# Patient Record
Sex: Female | Born: 1955 | Race: White | Hispanic: No | Marital: Married | Smoking: Never smoker
Health system: Southern US, Community
[De-identification: ages and names within clinical notes are randomized; demographics above are authoritative.]

## PROBLEM LIST (undated history)

## (undated) DIAGNOSIS — M797 Fibromyalgia: Secondary | ICD-10-CM

## (undated) DIAGNOSIS — F419 Anxiety disorder, unspecified: Secondary | ICD-10-CM

---

## 2006-09-01 ENCOUNTER — Encounter: Admission: RE | Admit: 2006-09-01 | Discharge: 2006-09-01 | Payer: Self-pay | Admitting: Family Medicine

## 2007-11-30 ENCOUNTER — Encounter: Admission: RE | Admit: 2007-11-30 | Discharge: 2007-11-30 | Payer: Self-pay | Admitting: Obstetrics and Gynecology

## 2008-11-30 ENCOUNTER — Encounter: Admission: RE | Admit: 2008-11-30 | Discharge: 2008-11-30 | Payer: Self-pay | Admitting: Obstetrics and Gynecology

## 2009-12-03 ENCOUNTER — Encounter: Admission: RE | Admit: 2009-12-03 | Discharge: 2009-12-03 | Payer: Self-pay | Admitting: Obstetrics and Gynecology

## 2010-11-08 ENCOUNTER — Other Ambulatory Visit: Payer: Self-pay | Admitting: Obstetrics and Gynecology

## 2010-11-08 DIAGNOSIS — Z1231 Encounter for screening mammogram for malignant neoplasm of breast: Secondary | ICD-10-CM

## 2010-12-09 ENCOUNTER — Ambulatory Visit
Admission: RE | Admit: 2010-12-09 | Discharge: 2010-12-09 | Disposition: A | Discharge status: Home / Self Care | Payer: BC Managed Care – PPO | Source: Ambulatory Visit | Attending: Obstetrics and Gynecology | Admitting: Obstetrics and Gynecology

## 2010-12-09 DIAGNOSIS — Z1231 Encounter for screening mammogram for malignant neoplasm of breast: Secondary | ICD-10-CM

## 2014-02-22 ENCOUNTER — Ambulatory Visit (INDEPENDENT_AMBULATORY_CARE_PROVIDER_SITE_OTHER): Payer: BC Managed Care – PPO

## 2014-02-22 DIAGNOSIS — M2042 Other hammer toe(s) (acquired), left foot: Secondary | ICD-10-CM

## 2014-02-22 DIAGNOSIS — M778 Other enthesopathies, not elsewhere classified: Secondary | ICD-10-CM

## 2014-02-22 DIAGNOSIS — M7750 Other enthesopathy of unspecified foot: Secondary | ICD-10-CM

## 2014-02-22 DIAGNOSIS — M21622 Bunionette of left foot: Secondary | ICD-10-CM

## 2014-02-22 DIAGNOSIS — G5762 Lesion of plantar nerve, left lower limb: Secondary | ICD-10-CM

## 2014-02-22 DIAGNOSIS — M779 Enthesopathy, unspecified: Secondary | ICD-10-CM

## 2014-02-22 DIAGNOSIS — G5760 Lesion of plantar nerve, unspecified lower limb: Secondary | ICD-10-CM

## 2014-02-22 DIAGNOSIS — R52 Pain, unspecified: Secondary | ICD-10-CM

## 2014-02-22 DIAGNOSIS — M21619 Bunion of unspecified foot: Secondary | ICD-10-CM

## 2014-02-22 DIAGNOSIS — M2040 Other hammer toe(s) (acquired), unspecified foot: Secondary | ICD-10-CM

## 2014-02-22 NOTE — Patient Instructions (Signed)
Pre-Operative Instructions  Congratulations, you have decided to take an important step to improving your quality of life.  You can be assured that the doctors of [facility] will be with you every step of the way.  1. Plan to be at the surgery center/hospital at least 1 (one) hour prior to your scheduled time unless otherwise directed by the surgical center/hospital staff.  You must have a responsible adult accompany you, remain during the surgery and drive you home.  Make sure you have directions to the surgical center/hospital and know how to get there on time. 2. For hospital based surgery you will need to obtain a history and physical form from your family physician within 1 month prior to the date of surgery- we will give you a form for you primary physician.  3. We make every effort to accommodate the date you request for surgery.  There are however, times where surgery dates or times have to be moved.  We will contact you as soon as possible if a change in schedule is required.   4. No Aspirin/Ibuprofen for one week before surgery.  If you are on aspirin, any non-steroidal anti-inflammatory medications (Mobic, Aleve, Ibuprofen) you should stop taking it 7 days prior to your surgery.  You make take Tylenol  For pain prior to surgery.  5. Medications- If you are taking daily heart and blood pressure medications, seizure, reflux, allergy, asthma, anxiety, pain or diabetes medications, make sure the surgery center/hospital is aware before the day of surgery so they may notify you which medications to take or avoid the day of surgery. 6. No food or drink after midnight the night before surgery unless directed otherwise by surgical center/hospital staff. 7. No alcoholic beverages 24 hours prior to surgery.  No smoking 24 hours prior to or 24 hours after surgery. 8. Wear loose pants or shorts- loose enough to fit over bandages, boots, and casts. 9. No slip on shoes, sneakers are best. 10. Bring  your boot with you to the surgery center/hospital.  Also bring crutches or a walker if your physician has prescribed it for you.  If you do not have this equipment, it will be provided for you after surgery. 11. If you have not been contracted by the surgery center/hospital by the day before your surgery, call to confirm the date and time of your surgery. 12. Leave-time from work may vary depending on the type of surgery you have.  Appropriate arrangements should be made prior to surgery with your employer. 13. Prescriptions will be provided immediately following surgery by your doctor.  Have these filled as soon as possible after surgery and take the medication as directed. 14. Remove nail polish on the operative foot. 15. Wash the night before surgery.  The night before surgery wash the foot and leg well with the antibacterial soap provided and water paying special attention to beneath the toenails and in between the toes.  Rinse thoroughly with water and dry well with a towel.  Perform this wash unless told not to do so by your physician.  Enclosed: 1 Ice pack (please put in freezer the night before surgery)   1 Hibiclens skin cleaner   Pre-op Instructions  If you have any questions regarding the instructions, do not hesitate to call our office.  [place]: [address] [place], [place] [address] [phone]  [place]: [address] [place], [place] [address] [phone]  [place]: [address]  [place], [place] [address] [phone]  Dr. Richard   Tuchman DPM, Dr. Norman Regal DPM Dr. Richard Sikora DPM, Dr. M. Todd Hyatt DPM, Dr. Kathryn Egerton DPM 

## 2014-02-22 NOTE — Progress Notes (Signed)
Subjective:    Patient ID: Victoria Merritt, female    DOB: 09/19/1955, 58 y.o.   MRN: [id number]  HPI  PT STATED LT FOOT BETWEEN 2ND AND 3RD TOE IS PAINFUL. FOOT HAVE SURGERY NEUROMA ABOUT ALMOST A YEAR. THE FOOT IS BEEN THE SAME AND STILL PAINFUL WHEN WALKING.  TRIED TAKING HYDROCODONE IT HELP.  ALSO. LT FOOT HAVE A KNOT OUTSIDE  OF THE FOOT AND IT HURT FOR LONG TERM. THE FOOT IS GETTING WORSE, AND SWELL. FOOT GET AGGRAVATED BY WALKING AND TRIED NO TREATMENT.   Review of Systems  HENT: Positive for hearing loss.   Musculoskeletal: Positive for gait problem.  All other systems reviewed and are negative.      Objective:   Physical Exam 58 year old white female well-developed well-nourished oriented x3 presents at this time for second opinion. Patient is a previous left foot surgery was 2 occasions or neuroma removed between her second and third toes metatarsals however continues to be painful also has a painful monitoring of her foot with the fifth metatarsal head or tailor bunion lower extremity objective findings as follows vascular status is intact with pedal pulses palpable DP +2/4 bilateral PT +2/4 bilateral capillary refill time 3 seconds all digits epicritic and proprioceptive sensations intact and symmetric bilateral there is normal plantar response DTRs not elicited dermatologically skin color pigment and hair growth normal decreased hair growth distally nail somewhat criptotic these healed incision scar for bunions bilateral is well-healed incision scar second interspace left foot. No open wounds no ulcers no secondary infection is noted. Orthopedic biomechanical exam patient has a rectus foot type on the right side left foot shows more significant met adductus the hallux appears rectus however there is medial deviation is of lesser digits with claw-type or hammertoe-type contractures adductovarus rotation is to 34 and 5 there is significant prominence of the fifth metatarsal with  lateral deviation of fifth metatarsal head and medial deviation of the fifth digit. X-rays confirm hammertoe deformities 2 through 5 left with medial displacement at the MTP joints of each of the digits as well as hallux slight hallux varus repair and status post Liane Comber bunionectomy which appears to be healing well consolidated again significant tailor bunion noted with elevated I am angle for 5 this is certainly exacerbated by the medial deviation of the digits. Clinically on palpation there is significant pain in the second intermetatarsal space more plantar than dorsal patient apparently had a neuroma but feels like is a knot or ball of the foot where there is a palpable soft tissue swelling in the second interspace cannot rule out a recurrent neuroma or stump neuroma formation.. Apparently her surgeon has recommended fifth metatarsal head resections for the tailor bunion as well as revision neurectomy the plantar incision for removal of recurrent neuroma second interspace. Panel is also suggested hammertoe repairs of the second and third toes.       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 I concur with Dr. Marion Downer assessment with the addition that I would correct all 3 hammertoes second third and fourth because they're all under lapping the fifth digit appears to be adequate and with this method resection should resolve varus and adductus position after concur  and I feel that I agree with Dr. Casilda Carls. Patient indicates that she would go back however there is a time constraint and he can get her into October, patient asked if we would be able to accommodate her sooner. At this time I agreed to perform surgery as are  ready stated fifth met head resection hammertoe repair to 3 and 4 with pin fixations and redo neurectomy second interspace the plantar incision on the left foot. Patient is given no guarantees however should have adequate results and relief of her pain in symptomology and significant deformities. 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

## 2014-03-01 ENCOUNTER — Telehealth: Payer: Self-pay | Admitting: *Deleted

## 2014-03-01 NOTE — Telephone Encounter (Signed)
I'm having surgery on Monday with Dr. Ralene Cork and Peggye Form been told to call and ask you if I'm going to need a boot.  Which I think I am.  If so, can I get it from you in advance?

## 2014-03-02 NOTE — Telephone Encounter (Signed)
I'm a patient of Dr. Ralene Cork.  I'm having surgery on Monday and was told I need to pick up a boot in advance.  I need to find out about this.  Please, someone give me a call, call me after 2 pm today.  I only have today and tomorrow to pick it up.  Please give me a call after 2 pm, thank you.  I called and informed her that she does not need to come by to get a boot.  I told her I spoke to a Production designer, theatre/television/film, Luster Landsberg, at the surgical center and she spoke to the nurse.  Nurse stated she says if you have a boot don't forget to bring it with you.  I told her the boot will be dispensed at the surgical center if it is needed.  She stated okay great.

## 2014-03-06 DIAGNOSIS — G5760 Lesion of plantar nerve, unspecified lower limb: Secondary | ICD-10-CM

## 2014-03-06 DIAGNOSIS — M21619 Bunion of unspecified foot: Secondary | ICD-10-CM

## 2014-03-06 DIAGNOSIS — M2040 Other hammer toe(s) (acquired), unspecified foot: Secondary | ICD-10-CM

## 2014-03-08 ENCOUNTER — Telehealth: Payer: Self-pay

## 2014-03-08 NOTE — Telephone Encounter (Signed)
Left message for pt to call with questions or concerns  

## 2014-03-09 NOTE — Progress Notes (Signed)
Dr Blenda Mounts performed a 5th met head res and left 2nd met neurectomy

## 2014-03-10 ENCOUNTER — Telehealth: Payer: Self-pay | Admitting: *Deleted

## 2014-03-10 NOTE — Telephone Encounter (Signed)
I had surgery with Dr. Ralene Cork on Monday.  In the last 12 to 15 hours I have had a lot of extra pain in one of the surgical spots.  I need to talk to someone about that.  Please call back, thank you.  Message already answered.

## 2014-03-10 NOTE — Telephone Encounter (Signed)
I had surgery with Dr. Ralene Cork on Monday.  He did some Hammertoes, Bunion and a Neuroma.  I'm having a lot of pain with the Hammer Toes.  I have an appointment on Monday.  I don't know if I should try to push through to Monday or get it checked today. It seems to have gotten worse in the last 18 hours.  I called and asked the patient if the bandage felt too tight.  She stated that it does feel tight.  I told her to remove the ace bandage only and re-apply it loosely.  I asked her how often she was taking the pain medication and how many.  She stated that she was taking one every 6-8 hours.  I told her that she can take 2 pills every 4-6 hours.  She stated she would try that and that she has enough pain medication to last her until Monday.

## 2014-03-14 ENCOUNTER — Ambulatory Visit (INDEPENDENT_AMBULATORY_CARE_PROVIDER_SITE_OTHER): Payer: BC Managed Care – PPO

## 2014-03-14 VITALS — BP 115/60 | HR 100 | Resp 12

## 2014-03-14 DIAGNOSIS — M2040 Other hammer toe(s) (acquired), unspecified foot: Secondary | ICD-10-CM

## 2014-03-14 DIAGNOSIS — M21619 Bunion of unspecified foot: Secondary | ICD-10-CM

## 2014-03-14 DIAGNOSIS — M2042 Other hammer toe(s) (acquired), left foot: Secondary | ICD-10-CM

## 2014-03-14 DIAGNOSIS — G5762 Lesion of plantar nerve, left lower limb: Secondary | ICD-10-CM

## 2014-03-14 DIAGNOSIS — M21622 Bunionette of left foot: Secondary | ICD-10-CM

## 2014-03-14 DIAGNOSIS — G5760 Lesion of plantar nerve, unspecified lower limb: Secondary | ICD-10-CM

## 2014-03-14 DIAGNOSIS — Z09 Encounter for follow-up examination after completed treatment for conditions other than malignant neoplasm: Secondary | ICD-10-CM

## 2014-03-14 NOTE — Progress Notes (Signed)
   Subjective:    Patient ID: Victoria Merritt, female    DOB: 24-Aug-1955, 58 y.o.   MRN: [id number]  HPI   5th met head res and left 2nd met neurectomy.  ''LT FOOT HAVE DULL PAIN AND IS HARD TO PUT PRESSURE ON IT.'  Review of Systems no new findings or systemic changes noted     Objective:   Physical Exam Neurovascular status is intact pedal pulses palpable incision is well coapted intact and dry dressings are noted presto compressive dressing was reapplied to the left foot at this time including the plantar incision site x-rays reveal adequate resection of bone fifth metatarsal head and hammertoe repairs digits 23 and 4. Dressing was reapplied maintain elevation ice and moderate activities ambulation with crutches rollabout to       Assessment & Plan:  Assessment good postop progress minimal pain or discomfort presto dressing reapplied maintain ankle ibuprofen as needed for pain maintain rollabout and reduce activities elevated ice when possible. Recheck in one week plan for suture removal and toes. Remove sutures plantar incision in 2 weeks from now we'll reevaluate next visit to make that decision.  Harriet Masson DPM

## 2014-03-14 NOTE — Patient Instructions (Signed)

## 2014-03-21 ENCOUNTER — Ambulatory Visit (INDEPENDENT_AMBULATORY_CARE_PROVIDER_SITE_OTHER): Payer: BC Managed Care – PPO

## 2014-03-21 VITALS — BP 134/85 | HR 78 | Resp 15

## 2014-03-21 DIAGNOSIS — Z09 Encounter for follow-up examination after completed treatment for conditions other than malignant neoplasm: Secondary | ICD-10-CM

## 2014-03-21 DIAGNOSIS — G5762 Lesion of plantar nerve, left lower limb: Secondary | ICD-10-CM

## 2014-03-21 DIAGNOSIS — M21622 Bunionette of left foot: Secondary | ICD-10-CM

## 2014-03-21 DIAGNOSIS — M2042 Other hammer toe(s) (acquired), left foot: Secondary | ICD-10-CM

## 2014-03-21 DIAGNOSIS — G5760 Lesion of plantar nerve, unspecified lower limb: Secondary | ICD-10-CM

## 2014-03-21 DIAGNOSIS — M2040 Other hammer toe(s) (acquired), unspecified foot: Secondary | ICD-10-CM

## 2014-03-21 DIAGNOSIS — M21619 Bunion of unspecified foot: Secondary | ICD-10-CM

## 2014-03-21 NOTE — Progress Notes (Signed)
   Subjective:    Patient ID: Victoria Merritt, female    DOB: 01/18/1956, 58 y.o.   MRN: [id number]  HPI Comments: DOS 03/06/2014 left 5th metatarsal head resection, 2,3,4 hammer toe repair with pins and 2nd neurectomy.  I removed sutures from 2,3,4 toes and 5th metatarsal dorsal surgery site, and plantar 2nd metatarsal space every other suture.     Review of Systems no new findings or systemic changes noted     Objective:   Physical Exam Neurovascular status is intact pedal pulses palpable incisions clean dry well coapted dressings intact and dry at this time sutures are removed from the toes and plantar incision area all the areas are clean dry well coapted at this time in light gauze dressing was reapplied to the forefoot and Coflex wrappings applied to the toe patient will maintain Coflex wrap in her toes leave the dressing on until Sunday at which time she may remove the dressings may resume normal bathing and hygiene washing the foot dry and well apply Neosporin cocoa butter to the incisions. Also [employer] was dispensed to maintain compression after her dressing change and in her begin bathing after Sunday.       Assessment & Plan:  Assessment good postop progress incision is well coapted mild edema and ecchymosis consistent with postop course no dehiscence no discharge or drainage noted me weightbearing at this time with air fracture boot in place reappointed 3-4 weeks for followup x-ray and possible pin removal at that time. Assessment good postop progress again Coflex dispensed to maintain wrapping on toes anklet to maintain compression forefoot starting Sunday may resume normal bathing and hygiene  Alvan Dameichard Michel Hendon DPM

## 2014-03-21 NOTE — Patient Instructions (Addendum)
    ICE INSTRUCTIONS  Apply ice or cold pack to the affected area at least 3 times a day for 10-15 minutes each time.  You should also use ice after prolonged activity or vigorous exercise.  Do not apply ice longer than 20 minutes at one time.  Always keep a cloth between your skin and the ice pack to prevent burns.  Being consistent and following these instructions will help control your symptoms.  We suggest you purchase a gel ice pack because they are reusable and do bit leak.  Some of them are designed to wrap around the area.  Use the method that works best for you.  Here are some other suggestions for icing.   Use a frozen bag of peas or corn-inexpensive and molds well to your body, usually stays frozen for 10 to 20 minutes. Wet a towel with cold water and squeeze out the excess until it's damp.  Place in a bag in the freezer for 20 minutes. Then remove and use.ANTIBACTERIAL SOAP INSTRUCTIONS  THE DAY AFTER PROCEDURE  Please follow the instructions your doctor has marked.   Shower as usual. Before getting out, place a drop of antibacterial liquid soap (Dial) on a wet, clean washcloth.  Gently wipe washcloth over affected area.  Afterward, rinse the area with warm water.  Blot the area dry with a soft cloth and cover with antibiotic ointment (neosporin, polysporin, bacitracin) and band aid or gauze and tape  Place 3-4 drops of antibacterial liquid soap in a quart of warm tap water.  Submerge foot into water for 20 minutes.  If bandage was applied after your procedure, leave on to allow for easy lift off, then remove and continue with soak for the remaining time.  Next, blot area dry with a soft cloth and cover with a bandage.  Apply other medications as directed by your doctor, such as cortisporin otic solution (eardrops) or neosporin antibiotic ointment  Resume normal bathing and hygiene wash with soap and water apply Neosporin or cocoa butter to the incision areas. Maintain Coflex wrap in  the toes to control the edema and swelling. Maintain surgical shoe at all times as instructed.

## 2014-04-11 ENCOUNTER — Ambulatory Visit (INDEPENDENT_AMBULATORY_CARE_PROVIDER_SITE_OTHER): Payer: BC Managed Care – PPO

## 2014-04-11 VITALS — BP 112/78 | HR 74 | Resp 17

## 2014-04-11 DIAGNOSIS — M205X2 Other deformities of toe(s) (acquired), left foot: Secondary | ICD-10-CM

## 2014-04-11 DIAGNOSIS — Z9889 Other specified postprocedural states: Secondary | ICD-10-CM

## 2014-04-11 DIAGNOSIS — M2042 Other hammer toe(s) (acquired), left foot: Secondary | ICD-10-CM

## 2014-04-11 DIAGNOSIS — M21622 Bunionette of left foot: Secondary | ICD-10-CM

## 2014-04-11 DIAGNOSIS — G5762 Lesion of plantar nerve, left lower limb: Secondary | ICD-10-CM

## 2014-04-11 NOTE — Progress Notes (Signed)
   Subjective:    Patient ID: Victoria Merritt, female    DOB: [birth year], 58 y.o.   MRN: [id number]  HPI  Pt presents for routine post op, no complications left hammertoe repair 2,3,4 met, external pins still in place  Review of Systems no new findings or systemic changes noted     Objective:   Physical Exam Patient this time is 5 weeks status post hammertoe repair 234 and 5 and fifth met head resection. Patient flies the dorsal incision is having some delayed healing it is now closing the past week hammertoes appear be well healed and coapted x-rays reveal good position of pin fixation in good alignment of the digits at this time K wires of toes 234 remove Neosporin and a bandage and Coflex wrap in the toes applied patient may resume normal bathing and hygiene normal ambulation without crutches to walk flat in maintain Coflex wrap in the toes may discontinue boot with him next week and resume walking tennis or athletic shoes.       Assessment & Plan:  Assessment good postop progress all incisions appear to be well coapted at this time no discharge no drainage mild edema consistent with postop course noted continue with Coflex wrapping and a compression stocking reappointed in one month for long-term postop followup contact us if any changes or exacerbations in the interim.  Harriet Masson DPM

## 2014-04-11 NOTE — Patient Instructions (Addendum)
ICE INSTRUCTIONS  Apply ice or cold pack to the affected area at least 3 times a day for 10-15 minutes each time.  You should also use ice after prolonged activity or vigorous exercise.  Do not apply ice longer than 20 minutes at one time.  Always keep a cloth between your skin and the ice pack to prevent burns.  Being consistent and following these instructions will help control your symptoms.  We suggest you purchase a gel ice pack because they are reusable and do bit leak.  Some of them are designed to wrap around the area.  Use the method that works best for you.  Here are some other suggestions for icing.   Use a frozen bag of peas or corn-inexpensive and molds well to your body, usually stays frozen for 10 to 20 minutes.  Wet a towel with cold water and squeeze out the excess until it's damp.  Place in a bag in the freezer for 20 minutes. Then remove and use.   Discontinue using crutches may put weight on foot for the first week utilizing the air fracture boot by the end of this week, of the boot and back into comfortable walking tennis or athletic shoes or [employer]'s. Do not walk on your heel to foot flat ground your toes to go flat ground. Maintain Coflex wrap in of toes 23 and 4 with a blue wrap. May resume all normal bathing and hygiene starting tomorrow

## 2014-04-17 ENCOUNTER — Ambulatory Visit (INDEPENDENT_AMBULATORY_CARE_PROVIDER_SITE_OTHER): Payer: BC Managed Care – PPO

## 2014-04-17 ENCOUNTER — Ambulatory Visit (INDEPENDENT_AMBULATORY_CARE_PROVIDER_SITE_OTHER): Payer: BC Managed Care – PPO | Admitting: Podiatry

## 2014-04-17 VITALS — BP 112/65 | HR 74 | Temp 98.1°F | Resp 17

## 2014-04-17 DIAGNOSIS — G8918 Other acute postprocedural pain: Secondary | ICD-10-CM

## 2014-04-17 DIAGNOSIS — L03032 Cellulitis of left toe: Secondary | ICD-10-CM

## 2014-04-17 MED ORDER — CLINDAMYCIN HCL 300 MG PO CAPS: 300.0000 mg | ORAL_CAPSULE | Freq: Three times a day (TID) | ORAL | Status: DC

## 2014-04-17 MED ORDER — OXYCODONE-ACETAMINOPHEN 5-325 MG PO TABS: 1.0000 | ORAL_TABLET | Freq: Four times a day (QID) | ORAL | Status: AC | PRN

## 2014-04-17 MED ORDER — CIPROFLOXACIN HCL 500 MG PO TABS
500.0000 mg | ORAL_TABLET | Freq: Two times a day (BID) | ORAL | Status: DC
Start: 2014-04-17 — End: 2014-04-28

## 2014-04-17 NOTE — Patient Instructions (Signed)
Start antibiotics. Monitor for any signs/symptoms of worsening infection. Call the office immediately if any occur or go directly to the emergency room. Call with any questions/concerns.

## 2014-04-17 NOTE — Progress Notes (Signed)
   Subjective:    Patient ID: Victoria Merritt, female    DOB: 04/23/1956, 58 y.o.   MRN: [id number]  HPI Victoria Merritt, 58 year old female, presents with complaints of increased pain, redness, and warmth to the digits. She recently had surgery on 03/06/14. At last point and patient had K wires removed. She states that after the wires removed she noticed increasing swelling and pain to the area as well as redness. She states that she has pain with range of motion of the digits. She denies any fevers, chills, nausea, vomiting. She denies any recent injury or trauma to the foot. No other complaints at this time.   Review of Systems  All other systems reviewed and are negative.      Objective:   Physical Exam Objective: AAO x3, NAD DP/PT pulses palpable bilaterally, CRT less than 3 seconds Protective sensation intact with Simms Weinstein monofilament, vibratory sensation intact, Achilles tendon reflex intact Mild erythema to the lesser digits on the left foot extending just proximal to the MTPJs. There is mild edema present. There is no areas of fluctuance or crepitus. There is mild increase in warmth over the digits. There is no ascending cellulitis. Range of motion of the MTPJ's intact. There are no open lesions. Incisions well coapted without any evidence of dehiscence. No calf pain with compression, swelling, warmth, erythema.      Assessment & Plan:  58 year old female presents with increased swelling, warmth, redness over her lesser digits left foot extending just proximal to the MTPJ's. -X-rays were obtained and reviewed with the patient. -Treatment options discussed including alternatives, risks, complications.  -For concern of infection prescribed clindamycin and ciprofloxacin. -Renewed Percocet due to pain. -Continue cam boot in the interim until symptoms resolve. -Follow up in 1 week with Dr. Ralene CorkSikora. In the meantime call the office many questions, concerns, change in symptoms.  Monitor for any clinical signs or symptoms of worsening infection and directed to call the office immediately if any are to occur ago directly to the emergency room.

## 2014-04-27 ENCOUNTER — Ambulatory Visit: Payer: BC Managed Care – PPO | Admitting: Podiatry

## 2014-04-28 ENCOUNTER — Encounter: Payer: Self-pay | Admitting: Podiatry

## 2014-04-28 ENCOUNTER — Ambulatory Visit (INDEPENDENT_AMBULATORY_CARE_PROVIDER_SITE_OTHER): Payer: BC Managed Care – PPO | Admitting: Podiatry

## 2014-04-28 VITALS — BP 115/68 | HR 98 | Temp 97.6°F | Resp 13

## 2014-04-28 DIAGNOSIS — G8918 Other acute postprocedural pain: Secondary | ICD-10-CM

## 2014-04-28 DIAGNOSIS — Z09 Encounter for follow-up examination after completed treatment for conditions other than malignant neoplasm: Secondary | ICD-10-CM

## 2014-04-28 DIAGNOSIS — L03032 Cellulitis of left toe: Secondary | ICD-10-CM

## 2014-04-28 MED ORDER — CIPROFLOXACIN HCL 500 MG PO TABS: 500.0000 mg | ORAL_TABLET | Freq: Two times a day (BID) | ORAL | Status: AC

## 2014-04-28 MED ORDER — CLINDAMYCIN HCL 300 MG PO CAPS: 300.0000 mg | ORAL_CAPSULE | Freq: Three times a day (TID) | ORAL | Status: AC

## 2014-04-28 NOTE — Patient Instructions (Signed)
Monitor for any signs/symptoms of infection. Call the office immediately if any occur or go directly to the emergency room. Call with any questions/concerns.  

## 2014-05-01 NOTE — Progress Notes (Signed)
Patient ID: Victoria Penneratricia Merritt, female   DOB: 07/15/1955, 58 y.o.   MRN: [id number]  Subjective: Victoria Merritt, 58 year old female, returns to the office for follow-up evaluation of left foot pain and redness. She does believe that the redness has decreased since her last appointment, although she states she continues to have pain to the area and there is still some redness, which was not present before this started. She has completed the course of antibiotics. She also states that she is very active with work, and has been on her feet a lot. She has not been able to wrap the toes to help decrease the edema, as they were painful to do so. She has been continuing with the CAM boot. Denies any systemic complaints such as fevers, chills, nausea, vomiting. No acute changes since last appointment. No other complaints at this time.   Objective: AAO x3, NAD DP/PT pulses palpable bilaterally, CRT less than 3 seconds Protective sensation intact with Simms Weinstein monofilament, vibratory sensation intact, Achilles tendon reflex intact Decreased erythema to the digits 2-5 on the left, although they are slightly erythematous. There is mild edema to the digits and the forefoot. No increase in warmth noted. Incisions are well coapted without any evidence of dehiscence.  No ascending cellulitis. No areas of fluctuance or crepitance.  No open lesions No calf pain with compression, swelling, warmth, erythema.   Assessment: 58 year old female with decreased erythema/edema to lesser digits left foot   Plan: -Treatment options discussed including all alternatives, risks, complications. -At this time there is decreased erythema, likely a result of the edema. However, will continue antibiotic for one more week. Clinda/Cipro was renewed and sent to the patients pharmacy. Continue to monitor for any clinical signs/symptoms of infection and directed to call the office immediately if any occur.  -Discussed with the patient to  continue wrapping the toes to help decrease the edema.  -She can continue wearing the CAM boot as needed, but can transition back into a regular shoe as tolerated.  -Follow-up as scheduled with Dr. Ralene CorkSikora, or sooner if needed. In the meantime, call the office with any questions, concerns, change in symptoms.

## 2014-05-09 ENCOUNTER — Ambulatory Visit (INDEPENDENT_AMBULATORY_CARE_PROVIDER_SITE_OTHER): Payer: BC Managed Care – PPO

## 2014-05-09 VITALS — BP 140/81 | HR 91 | Resp 12

## 2014-05-09 DIAGNOSIS — M2042 Other hammer toe(s) (acquired), left foot: Secondary | ICD-10-CM

## 2014-05-09 DIAGNOSIS — M205X2 Other deformities of toe(s) (acquired), left foot: Secondary | ICD-10-CM

## 2014-05-09 DIAGNOSIS — Z9889 Other specified postprocedural states: Secondary | ICD-10-CM

## 2014-05-09 DIAGNOSIS — G8918 Other acute postprocedural pain: Secondary | ICD-10-CM

## 2014-05-09 DIAGNOSIS — M21622 Bunionette of left foot: Secondary | ICD-10-CM

## 2014-05-09 NOTE — Patient Instructions (Signed)
ICE INSTRUCTIONS  Apply ice or cold pack to the affected area at least 3 times a day for 10-15 minutes each time.  You should also use ice after prolonged activity or vigorous exercise.  Do not apply ice longer than 20 minutes at one time.  Always keep a cloth between your skin and the ice pack to prevent burns.  Being consistent and following these instructions will help control your symptoms.  We suggest you purchase a gel ice pack because they are reusable and do bit leak.  Some of them are designed to wrap around the area.  Use the method that works best for you.  Here are some other suggestions for icing.   Use a frozen bag of peas or corn-inexpensive and molds well to your body, usually stays frozen for 10 to 20 minutes.  Wet a towel with cold water and squeeze out the excess until it's damp.  Place in a bag in the freezer for 20 minutes. Then remove and use.  Alternate warm compress and ice pack to help with postoperative edema and swelling stop operative long-term pain. Also suggested using Advil or ibuprofen 1 or 2 tablets 2 or 3 times daily for any postoperative inflammation and swelling and pain

## 2014-05-09 NOTE — Progress Notes (Signed)
   Subjective:    Patient ID: Victoria Merritt, female    DOB: 11/25/1955, 58 y.o.   MRN: [id number]  HPI Comments: DOS 03/06/14 left 5th metatarsal head resection, 2,3,4th hammer toe repair with pins.  Pt states the foot is still swollen and the toes are red.  Pt states she is attempting to transition into regular everyday shoe.     Review of Systems     Objective:   Physical Exam        Assessment & Plan:

## 2014-05-09 NOTE — Progress Notes (Signed)
   Subjective:    Patient ID: Victoria Merritt, female    DOB: 19-Jun-1956, 58 y.o.   MRN: [id number]  HPIpatient presents at this time for postop follow-up patient proxy 2 months status post hammertoe repair 234 and 5 and fifth met head resection of the left foot patient had some convocation after the K wires were removed early developed an infection of the foot are some possible localized cellulitis has gone through 2 rounds of antibiotics including clindamycin and Cipro and that seems to have helped and resolved the swelling and infection in pain    Review of Systemsno new findings or systemic changes noted     Objective:   Physical Exam Neurovascular status is intact pedal pulses are palpable DP and PT +2 over 4 capillary refill time 3 seconds. Epicritic sensations intact and symmetric bilateral patient is still having some occasional soreness or full-thickness sensation over the dorsum of the foot and lateral aspect of the foot and minimal edema the digits is noted no ecchymosis no erythema no increased temperature no fever chills no other systemic signs no cellulitis identified the wounds are well coapted at this time. Skin color pigment noted well-healed incisions are noted       Assessment & Plan:  Assessment good postop progress x-rays confirm good position the digits no osseous abnormalities at resection of fifth metatarsal head and relatively rectus digits 234 and 5 with consolidation of the arthroplasty or arthrodesis sites noting arthrosis versus complete consolidation. No pain or discomfort occasionally some twinging occasionally some aching and throbbing is consistent with postop course patient is advised swelling can last 3-6 months postsurgery. Maintain compression stocking for placement through the end of the month at this time recheck in 2 months for long-term postop follow-up and x-rays. Maintain good walking or athletic shoes at all times no barefoot no flimsy shoes or  flip-flops  Harriet Masson DPM

## 2014-06-20 ENCOUNTER — Ambulatory Visit (INDEPENDENT_AMBULATORY_CARE_PROVIDER_SITE_OTHER): Payer: BC Managed Care – PPO

## 2014-06-20 VITALS — BP 139/74 | HR 86 | Resp 12

## 2014-06-20 DIAGNOSIS — M21622 Bunionette of left foot: Secondary | ICD-10-CM

## 2014-06-20 DIAGNOSIS — M205X2 Other deformities of toe(s) (acquired), left foot: Secondary | ICD-10-CM

## 2014-06-20 DIAGNOSIS — M2042 Other hammer toe(s) (acquired), left foot: Secondary | ICD-10-CM

## 2014-06-20 DIAGNOSIS — Z9889 Other specified postprocedural states: Secondary | ICD-10-CM

## 2014-06-20 DIAGNOSIS — G5762 Lesion of plantar nerve, left lower limb: Secondary | ICD-10-CM

## 2014-06-20 MED ORDER — TRAMADOL HCL 50 MG PO TABS: 50.0000 mg | ORAL_TABLET | Freq: Three times a day (TID) | ORAL | Status: AC | PRN

## 2014-06-20 NOTE — Progress Notes (Signed)
   Subjective:    Patient ID: Victoria Penneratricia Lavigne, female    DOB: 08/02/1955, 58 y.o.   MRN: [id number]  HPI DOS 03/06/14 HAMMER TOE 2,3,4TH TOE LT FOOT. ''THE TOES ARE DOING OK.''   Review of Systems no new findings or systemic changes noted     Objective:   Physical Exam Neurovascular status is intact and unchanged patient is having minimal edema incisions clean dry well coapted minimal pain or discomfort otherwise functioning well x-rays revealed adequate resection of bone there still some slight contractures of toe although not painful or symptomatic on palpation or ambulation       Assessment & Plan:  Assessment good postoperative progress patient is ready for discharge we traveling [place] did request some pain meds for breakthrough pain into she's traveling has some issues prescription for tramadol is issued she's taken this before without issues although there may be some issue with taking it with Cymbalta. We'll monitor for any problems or difficulties shoes avoid any tight ballistic activities discharge to an as-needed basis for any future follow-up  Alvan Dameichard Euline Kimbler DPM

## 2014-06-20 NOTE — Patient Instructions (Signed)

## 2014-07-11 ENCOUNTER — Ambulatory Visit: Payer: BC Managed Care – PPO

## 2014-10-09 ENCOUNTER — Emergency Department (HOSPITAL_COMMUNITY)
Admission: EM | Admit: 2014-10-09 | Discharge: 2014-10-09 | Disposition: A | Discharge status: Home / Self Care | Payer: No Typology Code available for payment source | Attending: Emergency Medicine | Admitting: Emergency Medicine

## 2014-10-09 ENCOUNTER — Encounter (HOSPITAL_COMMUNITY): Payer: Self-pay | Admitting: Emergency Medicine

## 2014-10-09 ENCOUNTER — Emergency Department (HOSPITAL_COMMUNITY): Payer: No Typology Code available for payment source

## 2014-10-09 DIAGNOSIS — R197 Diarrhea, unspecified: Secondary | ICD-10-CM | POA: Diagnosis present

## 2014-10-09 DIAGNOSIS — R509 Fever, unspecified: Secondary | ICD-10-CM

## 2014-10-09 DIAGNOSIS — B349 Viral infection, unspecified: Secondary | ICD-10-CM | POA: Insufficient documentation

## 2014-10-09 DIAGNOSIS — R05 Cough: Secondary | ICD-10-CM

## 2014-10-09 DIAGNOSIS — M797 Fibromyalgia: Secondary | ICD-10-CM | POA: Diagnosis not present

## 2014-10-09 DIAGNOSIS — Z792 Long term (current) use of antibiotics: Secondary | ICD-10-CM | POA: Insufficient documentation

## 2014-10-09 DIAGNOSIS — F419 Anxiety disorder, unspecified: Secondary | ICD-10-CM | POA: Insufficient documentation

## 2014-10-09 DIAGNOSIS — Z79899 Other long term (current) drug therapy: Secondary | ICD-10-CM | POA: Insufficient documentation

## 2014-10-09 DIAGNOSIS — R1084 Generalized abdominal pain: Secondary | ICD-10-CM | POA: Insufficient documentation

## 2014-10-09 DIAGNOSIS — R059 Cough, unspecified: Secondary | ICD-10-CM

## 2014-10-09 HISTORY — DX: Fibromyalgia: M79.7

## 2014-10-09 HISTORY — DX: Anxiety disorder, unspecified: F41.9

## 2014-10-09 LAB — COMPREHENSIVE METABOLIC PANEL
ALBUMIN: 3 g/dL — AB (ref 3.5–5.2)
ALK PHOS: 77 U/L (ref 39–117)
ALT: 21 U/L (ref 0–35)
ANION GAP: 10 (ref 5–15)
AST: 34 U/L (ref 0–37)
BILIRUBIN TOTAL: 0.6 mg/dL (ref 0.3–1.2)
BUN: 12 mg/dL (ref 6–23)
CHLORIDE: 99 mmol/L (ref 96–112)
CO2: 26 mmol/L (ref 19–32)
CREATININE: 0.8 mg/dL (ref 0.50–1.10)
Calcium: 8.6 mg/dL (ref 8.4–10.5)
GFR, EST NON AFRICAN AMERICAN: 79 mL/min — AB (ref >90)
Glucose, Bld: 119 mg/dL — ABNORMAL HIGH (ref 70–99)
POTASSIUM: 3.3 mmol/L — AB (ref 3.5–5.1)
Sodium: 135 mmol/L (ref 135–145)
Total Protein: 6.4 g/dL (ref 6.0–8.3)

## 2014-10-09 LAB — CBC WITH DIFFERENTIAL/PLATELET
BASOS ABS: 0 10*3/uL (ref 0.0–0.1)
Basophils Relative: 0 % (ref 0–1)
EOS ABS: 0 10*3/uL (ref 0.0–0.7)
EOS PCT: 0 % (ref 0–5)
HCT: 33.7 % — ABNORMAL LOW (ref 36.0–46.0)
Hemoglobin: 11.6 g/dL — ABNORMAL LOW (ref 12.0–15.0)
Lymphocytes Relative: 8 % — ABNORMAL LOW (ref 12–46)
Lymphs Abs: 1.1 10*3/uL (ref 0.7–4.0)
MCH: 31.3 pg (ref 26.0–34.0)
MCHC: 34.4 g/dL (ref 30.0–36.0)
MCV: 90.8 fL (ref 78.0–100.0)
Monocytes Absolute: 1.7 10*3/uL — ABNORMAL HIGH (ref 0.1–1.0)
Monocytes Relative: 14 % — ABNORMAL HIGH (ref 3–12)
Neutro Abs: 9.8 10*3/uL — ABNORMAL HIGH (ref 1.7–7.7)
Neutrophils Relative %: 78 % — ABNORMAL HIGH (ref 43–77)
PLATELETS: 332 10*3/uL (ref 150–400)
RBC: 3.71 MIL/uL — ABNORMAL LOW (ref 3.87–5.11)
RDW: 13.7 % (ref 11.5–15.5)
WBC: 12.7 10*3/uL — ABNORMAL HIGH (ref 4.0–10.5)

## 2014-10-09 LAB — URINE MICROSCOPIC-ADD ON

## 2014-10-09 LAB — URINALYSIS, ROUTINE W REFLEX MICROSCOPIC
Bilirubin Urine: NEGATIVE
Glucose, UA: NEGATIVE mg/dL
Ketones, ur: NEGATIVE mg/dL
NITRITE: NEGATIVE
PROTEIN: 30 mg/dL — AB
Specific Gravity, Urine: 1.011 (ref 1.005–1.030)
UROBILINOGEN UA: 0.2 mg/dL (ref 0.0–1.0)
pH: 6 (ref 5.0–8.0)

## 2014-10-09 LAB — LIPASE, BLOOD: Lipase: 22 U/L (ref 11–59)

## 2014-10-09 LAB — I-STAT CG4 LACTIC ACID, ED: LACTIC ACID, VENOUS: 0.81 mmol/L (ref 0.5–2.0)

## 2014-10-09 MED ORDER — ONDANSETRON HCL 4 MG PO TABS: 4.0000 mg | ORAL_TABLET | Freq: Four times a day (QID) | ORAL | Status: AC

## 2014-10-09 MED ORDER — SODIUM CHLORIDE 0.9 % IV BOLUS (SEPSIS)
1000.0000 mL | Freq: Once | INTRAVENOUS | Status: AC
  Administered 2014-10-09: 1000 mL via INTRAVENOUS

## 2014-10-09 MED ORDER — ACETAMINOPHEN 325 MG PO TABS
650.0000 mg | ORAL_TABLET | Freq: Once | ORAL | Status: AC
  Administered 2014-10-09: 650 mg via ORAL
  Filled 2014-10-09: qty 2

## 2014-10-09 MED ORDER — ONDANSETRON HCL 4 MG/2ML IJ SOLN
4.0000 mg | Freq: Once | INTRAMUSCULAR | Status: AC
  Administered 2014-10-09: 4 mg via INTRAVENOUS
  Filled 2014-10-09: qty 2

## 2014-10-09 NOTE — ED Provider Notes (Signed)
CSN: [id number]     Arrival date & time 10/09/14  1217 History   First MD Initiated Contact with Patient 10/09/14 1326     Chief Complaint  Patient presents with  . Emesis  . Diarrhea     (Consider location/radiation/quality/duration/timing/severity/associated sxs/prior Treatment) HPI Comments: Patient with a history of Fibromyalgia and Anxiety presents today with complaints of nausea, diarrhea, body aches, cough, and fever.  She states that symptoms have been present for the past 8 days.  She has not taken anything for symptoms to prior to arrival.  She reports that she had a headache a couple of days ago, but no headache at this time.  She does report mild generalized abdominal pain.  She reports a fever of 100-102 over the past few days.  She denies vomiting, sore throat, syncope, chest pain, or SOB.  Denies any blood in her stool.  No known sick contacts.  No recent hospitalizations or antibiotic use.   She did not have her Influenza vaccination this year.  She states that she is otherwise healthy aside from Fibromyalgia.    The history is provided by the patient.    Past Medical History  Diagnosis Date  . Fibromyalgia   . Anxiety    No past surgical history on file. No family history on file. History  Substance Use Topics  . Smoking status: Never Smoker   . Smokeless tobacco: Not on file  . Alcohol Use: No   OB History    No data available     Review of Systems  All other systems reviewed and are negative.     Allergies  Benadryl; Codeine; and Morphine and related  Home Medications   Prior to Admission medications   Medication Sig Start Date End Date Taking? Authorizing Provider  ALPRAZolam Prudy Feeler) 0.5 MG tablet Take 0.5 mg by mouth daily.  02/20/14  Yes Historical Provider, MD  DULoxetine (CYMBALTA) 60 MG capsule Take 60 mg by mouth at bedtime.  03/06/14  Yes Historical Provider, MD  Probiotic Product (PROBIOTIC PO) Take 1 tablet by mouth daily.   Yes Historical  Provider, MD  traMADol (ULTRAM) 50 MG tablet Take 1 tablet (50 mg total) by mouth every 8 (eight) hours as needed. Patient taking differently: Take 50 mg by mouth every 8 (eight) hours as needed for moderate pain.  06/20/14  Yes Richard Ralene Cork, DPM  ciprofloxacin (CIPRO) 500 MG tablet Take 1 tablet (500 mg total) by mouth 2 (two) times daily. Patient not taking: Reported on 10/09/2014 04/28/14   Vivi Barrack, DPM  clindamycin (CLEOCIN) 300 MG capsule Take 1 capsule (300 mg total) by mouth 3 (three) times daily. Patient not taking: Reported on 10/09/2014 04/28/14   Vivi Barrack, DPM  oxyCODONE-acetaminophen (PERCOCET/ROXICET) 5-325 MG per tablet Take 1-2 tablets by mouth every 6 (six) hours as needed for severe pain. Patient not taking: Reported on 10/09/2014 04/17/14   Vivi Barrack, DPM   BP 117/60 mmHg  Pulse 116  Temp(Src) 100 F (37.8 C) (Oral)  Resp 16 Physical Exam  Constitutional: She appears well-developed and well-nourished.  HENT:  Head: Normocephalic and atraumatic.  Mouth/Throat: Oropharynx is clear and moist.  Eyes: EOM are normal. Pupils are equal, round, and reactive to light.  Neck: Normal range of motion. Neck supple.  Cardiovascular: Normal rate, regular rhythm and normal heart sounds.   Pulmonary/Chest: Effort normal and breath sounds normal. No respiratory distress. She has no wheezes. She has no rales.  Abdominal: Soft. Bowel  sounds are normal. She exhibits no distension and no mass. There is tenderness. There is no rebound and no guarding.  Mild diffuse tenderness to palpation  Musculoskeletal: Normal range of motion.  Neurological: She is alert.  Skin: Skin is warm and dry. No rash noted.  Psychiatric: She has a normal mood and affect.  Nursing note and vitals reviewed.   ED Course  Procedures (including critical care time) Labs Review Labs Reviewed  CBC WITH DIFFERENTIAL/PLATELET  COMPREHENSIVE METABOLIC PANEL  LIPASE, BLOOD  URINALYSIS,  ROUTINE W REFLEX MICROSCOPIC  I-STAT CG4 LACTIC ACID, ED    Imaging Review Dg Chest 2 View  10/09/2014   CLINICAL DATA:  Abdominal pain, emesis, diarrhea, body aches, headaches, chills, and cough over the last 8 days.  EXAM: CHEST - 2 VIEW  COMPARISON:  Chest x-ray 02/17/2014.  FINDINGS: The heart is mildly enlarged. Atherosclerotic calcifications are again noted at the aortic arch. The lungs are clear. Mild rightward curvature of the thoracic spine is stable. The visualized soft tissues and bony thorax are otherwise unremarkable.  IMPRESSION: 1. Borderline cardiomegaly without failure. 2. No acute cardiopulmonary disease.   Electronically Signed   By: Marin Robertshristopher  Mattern M.D.   On: 10/09/2014 14:43     EKG Interpretation None      MDM   Final diagnoses:  Cough   Patient presented today with nausea, diarrhea, body aches, cough, and fever. She also reported a headache, but states that the headache resolved a couple of days ago. She is non toxic appearing.  Otherwise healthy and not immunocompromised.  UA is negative.  CXR negative.  WBC count mildly elevated, but lactate WNL.  She was initially tachycardic.  However, tachycardia improved after given IVF and Tylenol to treat fever.  Suspect viral illness.  Symptoms improved while in the ED.  Patient tolerating PO liquids.  Feel that the patient is stable for discharge.  Return precautions given.      Santiago GladHeather Hadasa Gasner, PA-C 10/10/14 1455  Doug SouSam Jacubowitz, MD 10/11/14 [id number]

## 2014-10-09 NOTE — ED Notes (Signed)
Pt c/o abdominal pain, emesis, diarrhea, body aches, headaches, chills, and cough onset last 8 days ago. Fever 100 degrees F.

## 2015-01-05 ENCOUNTER — Encounter: Payer: Self-pay | Admitting: Podiatry

## 2015-01-05 ENCOUNTER — Ambulatory Visit (INDEPENDENT_AMBULATORY_CARE_PROVIDER_SITE_OTHER): Payer: No Typology Code available for payment source

## 2015-01-05 ENCOUNTER — Ambulatory Visit (INDEPENDENT_AMBULATORY_CARE_PROVIDER_SITE_OTHER): Payer: No Typology Code available for payment source | Admitting: Podiatry

## 2015-01-05 VITALS — BP 122/70 | HR 91 | Resp 18

## 2015-01-05 DIAGNOSIS — Z9889 Other specified postprocedural states: Secondary | ICD-10-CM

## 2015-01-05 DIAGNOSIS — R208 Other disturbances of skin sensation: Secondary | ICD-10-CM

## 2015-01-05 DIAGNOSIS — M2042 Other hammer toe(s) (acquired), left foot: Secondary | ICD-10-CM

## 2015-01-05 DIAGNOSIS — R200 Anesthesia of skin: Secondary | ICD-10-CM

## 2015-01-08 NOTE — Progress Notes (Signed)
Patient ID: Victoria Penneratricia Merritt, female   DOB: 10/14/1955, 59 y.o.   MRN: [id number]  Subjective: 59 year old female presents the office today for long-term follow-up status post left foot surgery performed September 2015 with Dr. Ralene CorkSikora. She states that she does get some numbness to her toes which has been ongoing since the surgery is remained unchanged. She said it feels feel as if they are asleep. She denies any specific pain to the surgical site. She is able to even a regular shoe without problems. [place] one at this [place] before going. No other complaints this time.  Objective: AAO 3, NAD DP/PT pulses palpable, CRT less than 3 seconds Protective sensation intact with Simms Weinstein monofilament. Subjectively there is numbness to the digits. There is no areas of pinpoint bony tenderness or pain the vibratory sensation to bilateral lower extremity. There is no edema, erythema, increase in warmth. There is slight contracture of the digits of the left foot. No open lesions or pre-ulcerative lesions. Incisions are all well-healed scar. No pain with calf compression, swelling, warmth, erythema.  Assessment: 59 year old female status post left foot surgery with continued numbness to the toes.  Plan: -Treatment options discussed including all alternatives, risks, and complications -X-rays were obtained and reviewed with the patient.  -Discussed with her that there is a small chance of some of the numbness to resolve however I believe that she will likely have numbness to the toes. -Continue shoe gear modifications. -Follow-up as needed or sooner if any problems arise. In the meantime, encouraged to call the office with any questions, concerns, change in symptoms.   Ovid CurdMatthew Wagoner, DPM

## 2016-01-17 IMAGING — CR DG CHEST 2V
1 series · 1 of 1 positions shown · non-contrast
Comparison: Chest x-ray 02/17/2014.

CLINICAL DATA: Abdominal pain, emesis, diarrhea, body aches,
headaches, chills, and cough over the last 8 days.

EXAM:
CHEST - 2 VIEW

[w chest lat]
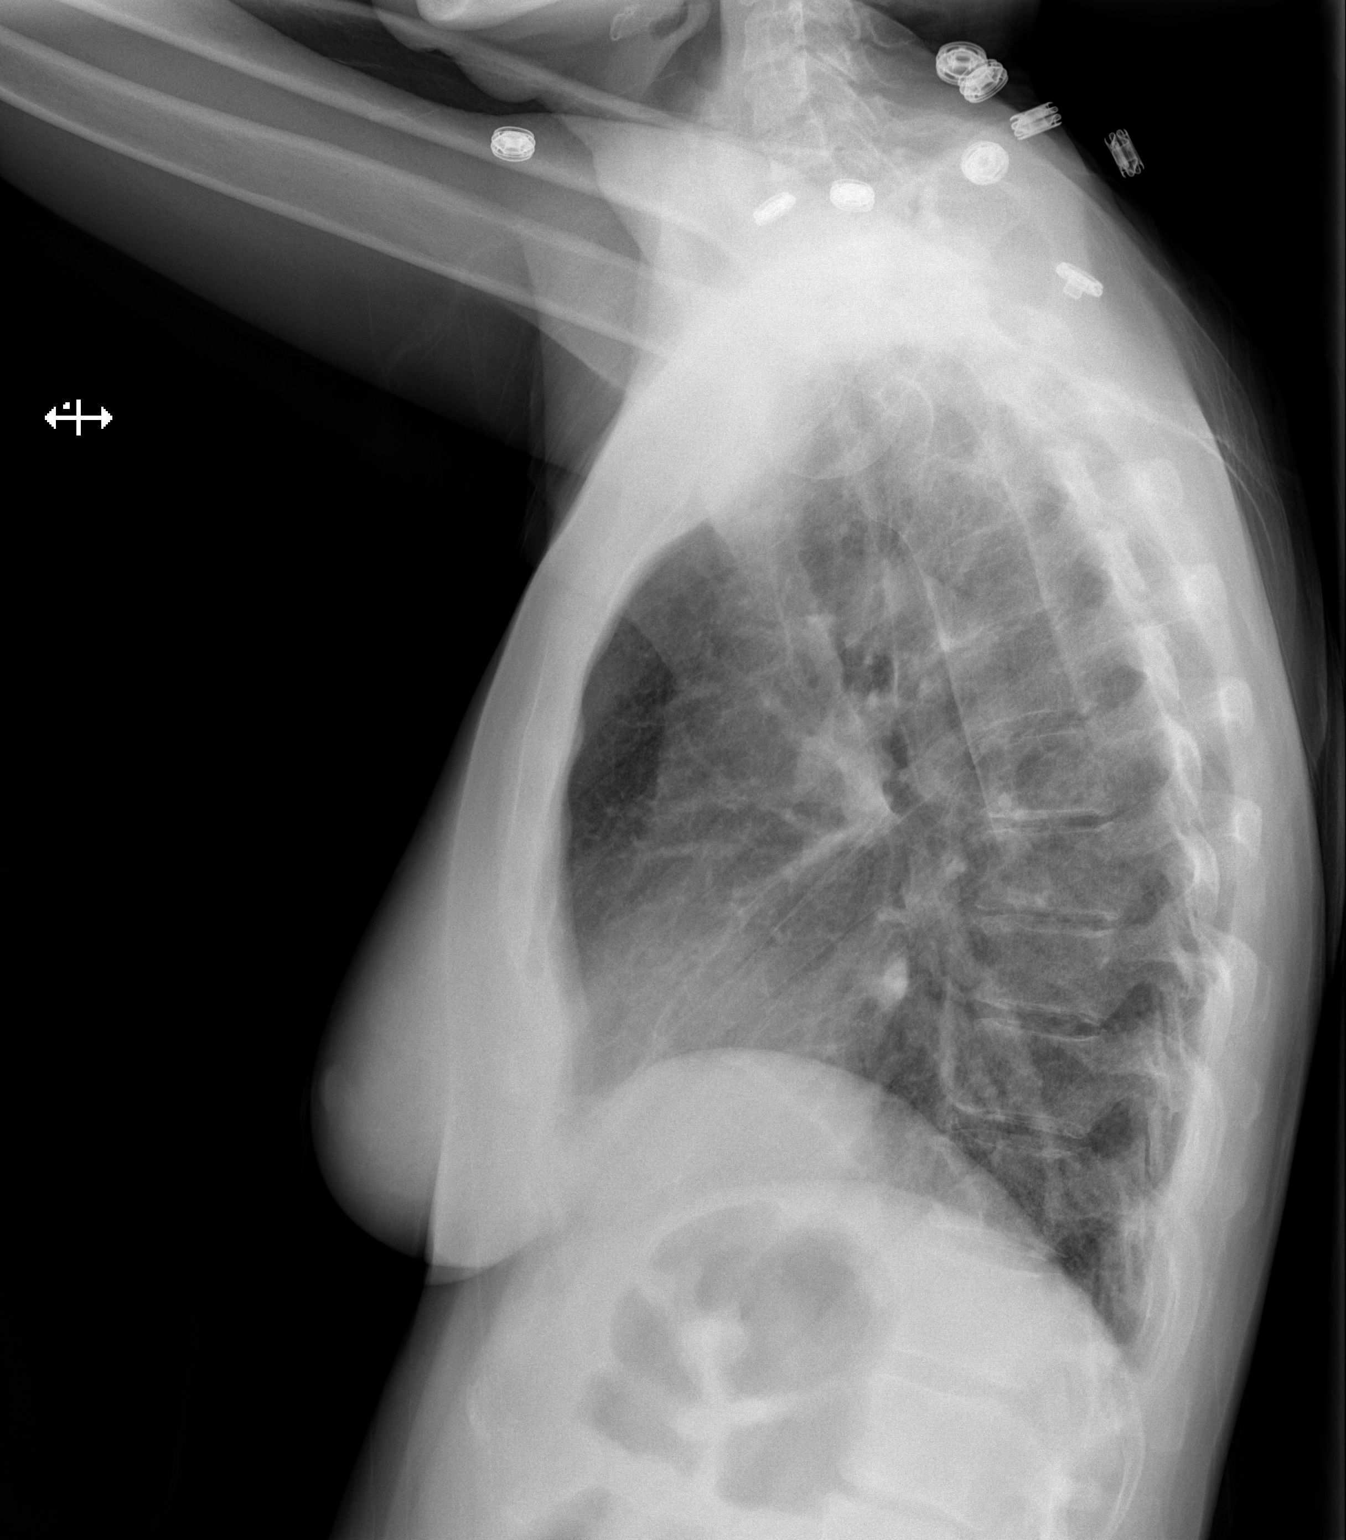

[1 of 1 positions shown; findings below may reference images not displayed]

FINDINGS: The heart is mildly enlarged. Atherosclerotic calcifications are
again noted at the aortic arch. The lungs are clear. Mild rightward
curvature of the thoracic spine is stable. The visualized soft
tissues and bony thorax are otherwise unremarkable.
IMPRESSION: 1. Borderline cardiomegaly without failure.
2. No acute cardiopulmonary disease.

## 2021-04-15 NOTE — Progress Notes (Signed)
Formatting of this note might be different from the original.    Self Swab Type: Anterior Nasal    Time of patient swab  Electronically signed by Gay Filler, NP at 04/15/2021  5:39 PM EDT

## 2022-07-09 NOTE — Telephone Encounter (Signed)
Formatting of this note might be different from the original.  Colonoscopy on 09/18/2022 at 1:00 pm at [facility].    MAG CITRATE PREP  Electronically signed by Leonarda Salon K at 07/09/2022 11:07 AM EST

## 2022-07-09 NOTE — Assessment & Plan Note (Signed)
Associated Problem(s): Fecal incontinence  Formatting of this note might be different from the original.  Happens both during the day and nocturnal.    Start FiberCon tablets 2 at night 1 in the morning.  Electronically signed by Hoyt Koch, NP at 07/09/2022 12:43 PM EST

## 2022-07-09 NOTE — Assessment & Plan Note (Signed)
Associated Problem(s): Irritable bowel syndrome with both constipation and diarrhea  Formatting of this note might be different from the original.  Patient diagnosed with irritable bowel symptoms with both diarrhea and constipation by her GI in [place].  Describes alternating episodes of 10 days of diarrhea followed by 10 days of constipation.  Uses Linzess 72 mcg for the constipation but this also triggers diarrhea.  Last colonoscopy in 2020 with lymphocytic colitis.  Currently taking Linzess as needed, Senokot 3 tablets daily and MiraLAX as needed  Electronically signed by Hoyt Koch, NP at 07/09/2022 12:43 PM EST

## 2022-07-09 NOTE — Progress Notes (Signed)
Formatting of this note is different from the original.  Images from the original note were not included.    [facility]  A Division of [facility], [place]     Angelique Blonder, M.D.   *   Sherrilee Gilles. Roger Shelter., M.D.   *   Cathren Harsh, M.D.   *   Selena Batten, D.O. * Audry Riles, MD      Lynnae Sandhoff, [place], PA-C   *   JEFFREY Bentley, NP   *   Dede Query, NP  *  AMY [place], NP  *  Myna Hidalgo, NP * JESSICA [place], FNP    [address], [place], [place]   [address]  Phone [phone]        Fax [phone]    [address], [place]  [address]  Phone [phone]        Fax [phone]    Office Visit Note     07/09/2022    Katherine Wells            MRN: [id number]  DOB: [birth year]  ----------------------------------------  Referring Physician  :  No referring provider defined for this encounter.     Chief Complaint   Patient presents with    Diarrhea    Encopresis    Abdominal Pain    Lymphocytic colitis    Constipation     Subjective:     Katherine Wells is a 67 y.o. female who is being seen for Irritable bowel symptoms with constipation and diarrhea and new fecal incontinence.    Patient reports that she was followed closely by the gastroenterologist in [place].  Has a significant past medical history that includes COVID long-haul syndrome.  Today she reports that she has multiple ongoing issues with the worst being her IBS with constipation diarrhea and new fecal incontinence.  Constipation diarrhea has been ongoing for years.  Notes having 10 days of diarrhea followed by 10 days of constipation.  Constipation is usually fixed with taking the Linzess 72 mcg but when she does this she has severe diarrhea for a few days and has to lay in bed because it "drains her".  She has tried other medications for the constipation like MiraLAX and Senokot but none of these work independently.  She does describe spontaneous  stools at times but not very frequently.  Had a colonoscopy in 2020 in [place] showing lymphocytic colitis as well as a small adenomatous polyp.  Denies any associated melena or hematochezia.  Does describe nocturnal fecal incontinence.  Notes initially happened [place] was [place] of the country.  Woke up "cold and wet" founders have to be lying in a pool of stool.  She now describes these episodes as "[employer]".  Occurs quite frequently, sometimes out in public sometimes at night.    Denies any recent medication changes.  She does take over-the-counter milk thistle for fatty liver disease diagnosed many years ago but most recent LFTs were normal.  She eats a lot of dietary fiber but denies any supplemental fiber.  Not really taking anything for the diarrhea, just anticonstipation medications most days.  Did try budesonide/Entocort for her lymphocytic colitis years ago but this caused her to have a significant side effect so she discontinued it.    Currently without any upper GI symptoms denying any nausea, vomiting, dysphagia or odynophagia.  Past Medical, Social, and Family History reviewed today.    Smoker: No  Family history of GI issues: No  Blood Thinners: No    PHQ Depression Screening     PHQ2 Score: 0    PHQ2/9 is interpreted as Negative in this patient        -    Procedures:  Reported colonoscopy with polypectomy.  No records to review.    Imaging:  No recent relevant imaging.    Review of Records:  I have independently reviewed past medical history, past surgical history, social history, allergies, medications, and all recent labs/imaging scans available    Allergies:  Allergies   Allergen Reactions    Morphine Hives/Swelling-Allergy     Past Medical History:  History reviewed. No pertinent past medical history.    Past Surgical History:  Past Surgical History:   Procedure Laterality Date    HYSTERECTOMY  1993    Has ovaries     Review of Systems:    Review of Systems   Constitutional: Negative.     Respiratory: Negative.     Cardiovascular: Negative.    Gastrointestinal:  Positive for abdominal pain, constipation and diarrhea.   All other systems reviewed and are negative.    Objective:    Physical Exam:  BP 120/70   Ht 154.9 cm (61")   Wt 54 kg (119 lb)   BMI 22.48 kg/m     Physical Exam  Vitals reviewed.   Constitutional:       General: She is not in acute distress.     Appearance: Normal appearance.   HENT:      Head: Normocephalic and atraumatic.   Eyes:      General: No scleral icterus.  Pulmonary:      Effort: Pulmonary effort is normal. No respiratory distress.   Abdominal:      General: There is no distension.   Musculoskeletal:         General: Normal range of motion.   Skin:     General: Skin is warm.   Neurological:      General: No focal deficit present.      Mental Status: She is alert and oriented to person, place, and time.   Psychiatric:         Mood and Affect: Mood normal.         Behavior: Behavior normal.     Recent Studies:  Results for orders placed or performed in visit on 05/21/22   HPV with Extended Genotype    Specimen: Cervix, Endocervical; Tissue   Result Value Ref Range    HPV Assay Negative Negative    HPV High Risk Genotype 16 Negative Negative    HPV High Risk Genotype 18 Negative Negative    HPV High Risk Genotype 45 Negative Negative    HPV High Risk Genotype 33/58 Negative Negative    HPV High Risk Genotype 31 Negative Negative    HPV High Risk Genotype 56/59/66 Negative Negative    HPV High Risk Genotype 51 Negative Negative    HPV High Risk Genotype 52 Negative Negative    HPV High Risk Genotype 35/39/68 Negative Negative   Pap, Cytology Only   Result Value Ref Range    Case Report       Gynecological Cytology [facility]                         Case: [id number]  Authorizing Provider:  Lesly Rubenstein, Collected:           05/21/2022 1034                                     DO                                                                            Ordering Location:     [place]      Received:            05/21/2022 1035                                     [facility]                                                                 First Screen:          Recardo Evangelist, CT(ASCP)                                                     Rescreen:              Gregor Hams Audrea Muscat, CT(ASCP)                                                    Specimen:    GYN Sure Path, Cervix, Endocervical                                                       Specimen Adequacy Satisfactory for evaluation     INTERPRETATION Negative for intraepithelial lesion or malignancy     GYN Additional Findings Atrophic pattern.     Additional Information       GYN slide processing is performed at [facility], [address] [place] [address]    NOTE: The Pap smear is a screening test which is effective in detecting the majority of cases containing abnormal cells; however, a low but inherent false negative rate exists. False positives may also occur. The effect of false negatives can be minimized by routine periodic Pap smears and clinical follow-up of any unexplained signs or symptoms.    For additional information regarding Pap smear diagnoses and follow up guidelines, refer to the  [place] website, available at  www.[email or web address].ca.  This SurePath slide preparation was initially screened by the BD FocalPoint? GS Imaging System, an FDA approved device, and followed by guided screening.    Unless otherwise specified, cytology screening performed at [facility], [address], [place], [place] [address].        Medications          Accurate as of July 09, 2022 12:51 PM. If you have any questions, ask your nurse or doctor.             Existing Medications        Sig Disp Refill Start End   bisoprolol 5 MG tablet  Commonly known as: ZEBETA     Refill: 0    clonazePAM 1 mg tablet  Commonly known as:  KlonoPIN     Refill: 0    diclofenac 75 mg EC tablet  Commonly known as: VOLTAREN     Refill: 0    doxepin 50 mg capsule  Commonly known as: SINEquan     Refill: 0    DULoxetine 60 MG capsule  Commonly known as: CYMBALTA     Refill: 0    fluticasone propionate 50 mcg/actuation nasal spray  Commonly known as: FLONASE     Refill: 0    galantamine 8 MG 24 hr capsule  Commonly known as: RAZADYNE ER     Refill: 0    Intrarosa 6.5 mg Inst  Generic drug: prasterone (dhea)     Refill: 0    levothyroxine 25 mcg tablet  Commonly known as: SYNTHROID     Refill: 0    Linzess 72 mcg Cap capsule  Generic drug: linaCLOtide     Refill: 0    rosuvastatin 5 MG tablet  Commonly known as: CRESTOR     Refill: 0          Assessment/Plan:    Problem List Items Addressed This Visit         Digestive    Chronic diarrhea     Likely multifactorial given her history of lymphocytic colitis.  Chronic ongoing for years.       Lymphocytic colitis     Diagnosed after colonoscopy in 2020.  Patient reports that she was put on Entocort/budesonide and this caused severe side effects so she discontinued it.  Has not been taking any medication for this since.       Relevant Orders    Case Request GI: COLONOSCOPY, FLEXIBLE; DIAGNOSTIC, INCLUDING COLLECTION OF SPECIMEN(S) BY BRUSHING OR WASHING, WHEN PERFORMED (SEPARATE PROCEDURE) (Completed)    Diarrhea due to malabsorption     Patient with untreated underlying lymphocytic colitis.       Relevant Orders    US Abdomen Complete without Bladder    CBC w/Differential (Completed)    Liver Panel (Hepatic Profile)    Basic Metabolic Panel    Tissue Transglutaminase, IgA    Ferritin    Case Request GI: COLONOSCOPY, FLEXIBLE; DIAGNOSTIC, INCLUDING COLLECTION OF SPECIMEN(S) BY BRUSHING OR WASHING, WHEN PERFORMED (SEPARATE PROCEDURE) (Completed)    Irritable bowel syndrome with both constipation and diarrhea     Patient diagnosed with irritable bowel symptoms with both diarrhea and constipation by her GI in  [place].  Describes alternating episodes of 10 days of diarrhea followed by 10 days of constipation.  Uses Linzess 72 mcg for the constipation but this also triggers diarrhea.  Last colonoscopy in 2020 with lymphocytic colitis.  Currently taking Linzess as needed, Senokot 3 tablets daily and MiraLAX as needed  Other    Change in bowel habit - Primary     This changes been ongoing issue for the past 4 years.  Intermittent constipation with diarrhea with a normal colonoscopy in 2020 in [place] with only lymphocytic colitis of a small polyp noted.  Now having anal leakage with full fecal incontinence.       Relevant Orders    Case Request GI: COLONOSCOPY, FLEXIBLE; DIAGNOSTIC, INCLUDING COLLECTION OF SPECIMEN(S) BY BRUSHING OR WASHING, WHEN PERFORMED (SEPARATE PROCEDURE) (Completed)    Fecal incontinence     Happens both during the day and nocturnal.    Start FiberCon tablets 2 at night 1 in the morning.       Relevant Orders    Case Request GI: COLONOSCOPY, FLEXIBLE; DIAGNOSTIC, INCLUDING COLLECTION OF SPECIMEN(S) BY BRUSHING OR WASHING, WHEN PERFORMED (SEPARATE PROCEDURE) (Completed)    Case Request GI: COLONOSCOPY, FLEXIBLE; DIAGNOSTIC, INCLUDING COLLECTION OF SPECIMEN(S) BY BRUSHING OR WASHING, WHEN PERFORMED (SEPARATE PROCEDURE) (Completed)     Colonoscopy with anesthesia. Risks and benefits of the procedure were explained to the patient in detail.  Risks include but are not limited to bleeding, infection, tearing of the intestine, problems with heart and lung function, missed lesions and death.  Patient expressed understanding and is in agreement with above.  Discussed discontinue Linzess completely given she is only taking intermittently and this is causing worse diarrhea.  Add FiberCon tablets 2 at night 1 in the morning.  Add magnesium citrate supplements daily for underlying constipation.  Will need to discuss possible interventions for the lymphocytic colitis based on pathology findings on the  colonoscopy.  Check CBC BMP LFT ferritin TT IgA    No follow-ups on file.     Counseling and Educational Factors:  Labs reviewed/discussed during today's visit.  Prior testing/procedures were reviewed; testing was discussed in detail; old records were reviewed; indications and risks of procedure were discussed; prescription medication usage was discussed; advised to follow-up if symptoms worsen or do not completely resolve; symptomatic care was discussed.  Patient expressed understanding of instructions.    Lorin Picket,  NP-C    This chart has been completed using Engineer, civil (consulting) software, and certain words and phrases may not be transcribed as intended.    Scribe:  Personal Attestation    This document serves as a record of the service and decisions personally performed by the provider.     It was created on his/her behalf by Lottie Dawson, a trained medical scribe. The creation of this document is based on the patient's responses to questions from the provider and provider's statements to the medical scribe.  Electronically signed by Lorin Picket, NP at 07/09/2022 12:51 PM EST

## 2022-07-09 NOTE — Assessment & Plan Note (Signed)
Associated Problem(s): Diarrhea due to malabsorption  Formatting of this note might be different from the original.  Patient with untreated underlying lymphocytic colitis.  Electronically signed by Hoyt Koch, NP at 07/09/2022 12:42 PM EST

## 2022-07-09 NOTE — Assessment & Plan Note (Signed)
Associated Problem(s): Chronic diarrhea  Formatting of this note might be different from the original.  Likely multifactorial given her history of lymphocytic colitis.  Chronic ongoing for years.  Electronically signed by Hoyt Koch, NP at 07/09/2022 12:42 PM EST

## 2022-07-09 NOTE — Assessment & Plan Note (Signed)
Associated Problem(s): Lymphocytic colitis  Formatting of this note might be different from the original.  Diagnosed after colonoscopy in 2020.  Patient reports that she was put on Entocort/budesonide and this caused severe side effects so she discontinued it.  Has not been taking any medication for this since.  Electronically signed by Hoyt Koch, NP at 07/09/2022 12:42 PM EST

## 2022-07-09 NOTE — Assessment & Plan Note (Signed)
Associated Problem(s): Change in bowel habit  Formatting of this note might be different from the original.  This changes been ongoing issue for the past 4 years.  Intermittent constipation with diarrhea with a normal colonoscopy in 2020 in [place] with only lymphocytic colitis of a small polyp noted.  Now having anal leakage with full fecal incontinence.  Electronically signed by Hoyt Koch, NP at 07/09/2022 12:43 PM EST

## 2022-07-21 NOTE — Telephone Encounter (Signed)
Formatting of this note might be different from the original.  Confirmed fibercon 2 q hs and 1 q pm and sent referral to dietician per Merry Proud ok  Electronically signed by Avel Sensor, MA at 07/21/2022  2:38 PM EST

## 2022-07-21 NOTE — Telephone Encounter (Signed)
Formatting of this note might be different from the original.  Please call patient concerning FiberCon tablets, needing to verify directions. Also patient requesting referral to nutritionist. Please call when able. Thank you!   Electronically signed by Nolon Nations at 07/21/2022  9:15 AM EST

## 2022-07-25 NOTE — Progress Notes (Signed)
Formatting of this note is different from the original.  [facility]  [address]   [place] [address]    Phone: [phone]  Fax: [place] PA-C    Orthopedic Surgery and Sports Medicine    Date of Encounter: 07/25/2022    PATIENT INFORMATION:  Name: Katherine Wells  Age:  67 y.o.  Sex:  female   DOB:  1956/03/07    MRN:  [id number]  PCP:  Courtney Heys, APRN    CHIEF COMPLAINT:    Chief Complaint   Patient presents with    Left Shoulder - Pain     HPI: IASIA FORCIER, 67 y.o., female, she follows up in the office today for her left shoulder pain and MRI results.  She had a left shoulder rotator cuff repair in [place] about 2 years ago.  She moved down here a few months ago and doing a lot of overhead lifting and packing boxes and moving began having recurrent pain in the left shoulder.  She was evaluated by me and an MRI was ordered.  She follows up today for the results.  States she still has chronic pain in the left shoulder and loss of motion and strength.    HPI        PAST MEDICAL HISTORY:  History reviewed. No pertinent past medical history.    PAST SURGICAL HISTORY:  Past Surgical History:   Procedure Laterality Date    HYSTERECTOMY  1993    Has ovaries     FAMILY HISTORY:  History reviewed. No pertinent family history.    SOCIAL HISTORY:  Social History     Socioeconomic History    Marital status: Married     Spouse name: Not on file    Number of children: Not on file    Years of education: Not on file    Highest education level: Not on file   Occupational History    Not on file   Tobacco Use    Smoking status: Never    Smokeless tobacco: Never   Substance and Sexual Activity    Alcohol use: Yes     Alcohol/week: 14.0 standard drinks of alcohol     Types: 14 Glasses of wine per week    Drug use: Never    Sexual activity: Not Currently   Other Topics Concern    Not on file   Social History Narrative    Not on file     Social Determinants of Health      Financial Resource Strain: Not on file   Food Insecurity: Not on file   Transportation Needs: Not on file   Physical Activity: Not on file   Stress: Not on file   Social Connections: Unknown (04/18/2022)    Social Connections     Frequency of Communication with Friends and Family: Not asked     Frequency of Social Gatherings with Friends and Family: Not asked   Intimate Partner Violence: Unknown (04/18/2022)    Intimate Partner Violence     Fear of Current or Ex-Partner: Not asked     Emotionally Abused: Not asked     Physically Abused: Not asked     Sexually Abused: Not asked   Housing Stability: Not on file     MEDICATIONS:  Current Outpatient Medications   Medication Sig Dispense Refill    bisoprolol (ZEBETA) 5 MG tablet Oral for 90 Days  clonazePAM (KlonoPIN) 1 mg tablet       diclofenac (VOLTAREN) 75 mg EC tablet TAKE 1 TABLET BY MOUTH TWICE A DAY      doxepin (SINEquan) 50 mg capsule TAKE 1 CAPSULE (50 MG) BY MOUTH DAILY AT BEDTIME      DULoxetine (CYMBALTA) 60 MG capsule TAKE 1 CAPSULE BY MOUTH EVERY DAY      fluticasone propionate (FLONASE) 50 mcg/actuation nasal spray 1 spray in each nostril Nasally Once a day      galantamine (RAZADYNE ER) 8 MG 24 hr capsule TAKE 1 CAPSULE BY MOUTH EVERY DAY WITH BREAKFAST      Intrarosa 6.5 mg inst insert ONE SUPPOSITORY in THE VAGINA EVERY DAY      levothyroxine (SYNTHROID) 25 mcg tablet Oral      Linzess 72 mcg cap capsule TAKE 1 CAPSULE BY MOUTH EVERY DAY 30 MINUTES BEFORE THE FIRST MEAL OF THE DAY ON EMPTY STOMACH      rosuvastatin (CRESTOR) 5 MG tablet Oral for 90 Days       No current facility-administered medications for this visit.     ALLERGIES:  Morphine    REVIEW OF SYSTEMS:   Review of Systems    PHYSICAL EXAMINATION:  There were no vitals filed for this visit.    Physical Exam  General: No acute distress.  Cardiovascular: Regular rhythm by palpation of distal pulse, normal color and temperature, no concerning varicosities on symptomatic  side.  Pulmonary: breathing comfortably.  Psychiatric: well oriented, normal mood and affect.  Skin: No rashes, lesions or ulcers.  Ortho Exam exam of the left shoulder she has loss of motion actively.  She has about 90 degrees of abduction and forward flexion.  Passively I can take her to 120 with pain.  Weakness with resisted abduction along with internal and external rotation.  Pain with O'Brien's testing.  Normal motor and sensory function into the hand    DIAGNOSTIC IMAGING:  No results found for any visits on 07/25/22.    MRI report and images independently reviewed by me show:  1. Prior arthroscopic rotator cuff repair with an apparent recurrent full-thickness and nearly full width tear of the supraspinatus tendon, which is retracted medially to the level of the glenohumeral joint.  2. Small glenohumeral joint effusion extending into the subacromial/subdeltoid bursa.  3. Probable prior biceps tenotomy.  4. Moderate AC joint degenerative arthrosis.    Procedures:  Procedures    ASSESSMENT:  1. Nontraumatic tear of left rotator cuff, unspecified tear extent    2. Chronic bilateral low back pain without sciatica      PLAN:    She has a new full-thickness tear of the rotator cuff.  At this time I referred her to Dr. Alba Destine for surgical consultation.  She also has chronic low back pain and would like to be evaluated by spine specialist.  We put a referral into the spine center on her behalf.    Follow Up:  No follow-ups on file.    Orders:  No orders of the defined types were placed in this encounter.    Roel Cluck PA-C    Notes: Patient is to continue all medications as directed by prescribing physicians. Continuations on today's visit are made based on the patient's report of current medications.     Current Meds Verified: Current meds/immunizations reviewed, including purpose with pt. Med Recon list given to pt/family. Pt advised to discard old med lists and provide all providers with current list  at each  visit and carry list with them in case of emergency    Electronically signed by Camille Bal, PA at 07/25/2022  1:28 PM EST

## 2022-08-05 ENCOUNTER — Inpatient Hospital Stay: Admit: 2022-08-05 | Primary: Family Medicine

## 2022-08-05 DIAGNOSIS — Z713 Dietary counseling and surveillance: Secondary | ICD-10-CM

## 2022-08-05 NOTE — Progress Notes (Addendum)
Katherine Wells, [place], [place], LD  Outpatient Registered Dietitian  [facility]  Phone: [phone]  Fax: [phone]    Katherine Wells is a 67 y.o. female referred with the following diagnosis (es): Celiac disease [K90.0]  Diarrhea, unspecified [R19.7]     ASSESSMENT  PMH includes IBS, chronic diarrhea, constipation, fecal incontinence, hx lymphocytic colitis   Labs: reviewed  Meds: linzess, FiberCon, Magnesium Citrate, miralax    Patient stated goal: get extra weight off (goal 105-108#).     Referral from GI per pt request. Colonoscopy scheduled for 3/28. She is most concerned with her weight gain, states normal weight is 105-108#. Notes lifelong history of body dysmorphia.   Brought list of foods given by neurologist to eat &not to eat. Feels she's very restricted (& frustrated) doesn't understand why she's gained weight.   She did bring 3 day food log.    Reports counting calories for 30+ years. Eats 2 meals per day, breafast and main meal around 2-3pm with a few alcoholic beverages every night. At this time not willing to give up alcohol intake.     Complains of constant dehydration regardless of water intake.   Notes 10-12 days of no bowel movements, then explosion of stool, Bristol type 3 this week, first 'normal' consistency stool in a long time.     Factors impacting food choices:   -Established food preferences/eating behaviors  -Confusion on nutrition advice  -Current nutrition attitudes/beliefs  -Altered GI structure/function  -Skips meals  -Negative relationship with food/food fear  -Disordered Eating: skipping meals, preoccupation with food choices, guilt/shame with food choices, restrictive diet, avoiding food groups    Initial Diet Recall:   B: 2 slice wheat bread, 3 TB honey, 2 TB neufchtel cream cheese     L: Taco bell burrito supreme, large diet pepsi, 1 refill    Evening: 2 bluemoon light sky beers     Beverages: 80+ oz of club soda, 2 beers     Eating pattern  appears excessive in refined carbohydrate, and inadequate in protein, fiber, fruit, and vegetables.  +Lifestyle/Family Influence/Support: lives with spouse     Movement includes not discussed.     Stage of Change: Preparation.    Anthropometrics:   Height: 5\' 1"   Weight 119#   BMI: 22.48     Estimated Nutrition Needs:  MSJ x 1.2  (1,300kcal/d)      Protein: 60g/day (20%)     DIAGNOSIS:  Unbalanced diet related to nutrition attitudes/beliefs as evidenced by eating pattern as above, disordered eating behaviors, and desire for thinness.     INTERVENTION (60 minutes)  Discussed nutrition principles for weight management, including nutrient density of food choices and healthy food/lifestyle behaviors. Reviewed Reviewed Healthy Eating Plate as template for meal planning and portion guide, as well as basic meal pattern guidelines. Reviewed recall and provided recommendations for change. Answered questions. Addressed cognitive distortions regarding weight and nutrition.     Specific Topics Discussed:  - Daily targets for protein and fiber  - Strategies for increasing fiber at meals and snacks  -Viscous fiber can help provide consistency to loose stool   -Aim for at least 17g of fiber per day, slowly increase intake  -tracking what you eat is helpful to identify triggers     Utilized the following behavior change strategies: MI, reflection, goal setting              Materials Provided and Discussed:   -Fiber in Foods List  -20g  Protein Handout    Verbalized understanding of recommendations discussed.    Goals:  -aim for 20 grams of protein at breakfast  -increase soluble fiber in the diet     Recommendations:  -Structured meal times  -3 meals daily  -space protein out throughout the day   -increase soluble fiber intake      MONITORING & EVALUATION:  Monitor Food and Nutrient Intake, Medications, Physical Activity and Function, Anthropometrics, and Biochemical  Follow Up: 4/2 @ 11

## 2022-09-23 ENCOUNTER — Inpatient Hospital Stay: Admit: 2022-09-23 | Primary: Family Medicine

## 2022-09-23 DIAGNOSIS — Z713 Dietary counseling and surveillance: Secondary | ICD-10-CM

## 2022-09-23 NOTE — Progress Notes (Signed)
Christiane Ha, [place], [place], LD  Outpatient Registered Dietitian  [facility]  Phone: [phone]  Fax: [phone]  FOLLOW UP ASSESSMENT (09/23/2022):  Katherine Wells is a 67 y.o. female referred with the following diagnosis (es): Celiac disease [K90.0]  Diarrhea, unspecified [R19.7]     Previous Goals:  -aim for 20 grams of protein at breakfast PROGRESSING  -increase soluble fiber in the diet PROGRESSING    Over the last ~2 months she has been working on increasing her protein at breakfast, aiming for 20-30 grams and adding more soluble fiber foods. Also trying to get in some greens.   She shares she's working on accepting that she may be at a healthful weight for her age.    Taking FiberCon once a day now, working back up to 2x/day since shoulder surgery, combination of meds caused bout of constipation.    Concern with quality of sleep & night terrors, states new PCP strongly encouraged getting rid of alcohol while taking Klonopin, afraid of being taken off the med. She is working on this change by sticking to 1 light beer a day.     Had EGD and colonoscopy on 3/14, no signs of lymphocytic colitis.   1 or 2 times a week has a large amount of stool. The rest of bowel movements occur almost every time she urinates, consisting of Bristol type 1, does not endorse pain.     Diet Recall:  B: Greek yogurt & fruit, or 2 eggs, low fat sour cream, parmesan cheese, on egg life wrap   L: feta cheese on Wasa crisp, brussels sprouts, some chocolate after lunch  1 light beer   Beverages: 80oz of club soda mixed with 1/3 bottle kombucha, 1 light beer     -does have a few TB of honey throughout the day when she has a sweet craving. Only had chocolate this week because husband brought it in the house.     INTERVENTION:  Nutrition Education and Counseling (30 minutes)  -Discussed diet changes   -Reviewed [employer] Healthy Eating Plate as template for meal planning. Emphasized including mostly whole  foods and how this mirrors the Mediterranean diet recommended by neurologist.   -No one food is inherently 'bad' and aiming for at least 85% consisting of whole foods.     Goals:  -aim for 20 grams of protein at breakfast  -increase soluble fiber in the diet      Recommendations:  -Structured meal times  -3 meals daily  -space protein out throughout the day   -increase soluble fiber intake      Materials Provided and Discussed:   [employer] Healthy Eating Plate    Patient verbalized understanding of recommendations discussed.      MONITORING & EVALUATION:  Monitor Food and Nutrient Intake, Medications, Anthropometrics, and Biochemical  Follow Up: 6/4 @ 11    ___________________________________________________  INITIAL ASSESSMENT 08/05/2022   Katherine Wells is a 67 y.o. female referred with the following diagnosis (es): Celiac disease [K90.0]  Diarrhea, unspecified [R19.7]   PMH includes IBS, chronic diarrhea, constipation, fecal incontinence, hx lymphocytic colitis   Labs: reviewed  Meds: linzess, FiberCon, Magnesium Citrate, miralax     Patient stated goal: get extra weight off (goal 105-108#).      Referral from GI per pt request. Colonoscopy scheduled for 3/28. She is most concerned with her weight gain, states normal weight is 105-108#. Notes lifelong history of body dysmorphia.   Brought list of foods given by neurologist  to eat &not to eat. Feels she's very restricted (& frustrated) doesn't understand why she's gained weight.   She did bring 3 day food log.     Reports counting calories for 30+ years. Eats 2 meals per day, breafast and main meal around 2-3pm with a few alcoholic beverages every night. At this time not willing to give up alcohol intake.      Complains of constant dehydration regardless of water intake.   Notes 10-12 days of no bowel movements, then explosion of stool, Bristol type 3 this week, first 'normal' consistency stool in a long time.      Factors impacting food choices:   -Established  food preferences/eating behaviors  -Confusion on nutrition advice  -Current nutrition attitudes/beliefs  -Altered GI structure/function  -Skips meals  -Negative relationship with food/food fear  -Disordered Eating: skipping meals, preoccupation with food choices, guilt/shame with food choices, restrictive diet, avoiding food groups     Initial Diet Recall:   B: 2 slice wheat bread, 3 TB honey, 2 TB neufchtel cream cheese      L: Taco bell burrito supreme, large diet pepsi, 1 refill     Evening: 2 bluemoon light sky beers      Beverages: 80+ oz of club soda, 2 beers      Eating pattern appears excessive in refined carbohydrate, and inadequate in protein, fiber, fruit, and vegetables.  +Lifestyle/Family Influence/Support: lives with spouse      Movement includes not discussed.      Stage of Change: Preparation.     Anthropometrics:   Height: 5\' 1"   Weight 119#   BMI: 22.48      Estimated Nutrition Needs:  MSJ x 1.2  (1,300kcal/d)      Protein: 60g/day (20%)      DIAGNOSIS:  Unbalanced diet related to nutrition attitudes/beliefs as evidenced by eating pattern as above, disordered eating behaviors, and desire for thinness.      INTERVENTION (60 minutes)  Discussed nutrition principles for weight management, including nutrient density of food choices and healthy food/lifestyle behaviors. Reviewed Reviewed Healthy Eating Plate as template for meal planning and portion guide, as well as basic meal pattern guidelines. Reviewed recall and provided recommendations for change. Answered questions. Addressed cognitive distortions regarding weight and nutrition.      Specific Topics Discussed:  - Daily targets for protein and fiber  - Strategies for increasing fiber at meals and snacks  -Viscous fiber can help provide consistency to loose stool   -Aim for at least 17g of fiber per day, slowly increase intake  -tracking what you eat is helpful to identify triggers      Utilized the following behavior change strategies: MI,  reflection, goal setting              Materials Provided and Discussed:              -Fiber in Foods List  -20g Protein Handout     Verbalized understanding of recommendations discussed.     Goals:  -aim for 20 grams of protein at breakfast  -increase soluble fiber in the diet      Recommendations:  -Structured meal times  -3 meals daily  -space protein out throughout the day   -increase soluble fiber intake      MONITORING & EVALUATION:  Monitor Food and Nutrient Intake, Medications, Physical Activity and Function, Anthropometrics, and Biochemical  Follow Up: 4/2 @ 11

## 2022-11-25 ENCOUNTER — Inpatient Hospital Stay: Admit: 2022-11-25 | Primary: Family Medicine

## 2022-11-25 DIAGNOSIS — Z713 Dietary counseling and surveillance: Secondary | ICD-10-CM

## 2022-11-25 NOTE — Progress Notes (Signed)
Hoyle Sauer, MS, RD, LD  Outpatient Registered Dietitian  [facility]  Phone: [phone]  Fax: [phone]    FOLLOW UP ASSESSMENT (11/25/2022):  Nataysha Buffkin is a 67 y.o. female referred with the following diagnosis (es): Celiac disease [K90.0]  Diarrhea, unspecified [R19.7]   Meds/Supplements: taking Fibercon 2 pills one day 1 pill next day, Magnesium Citrate 2x/day    Previous Goals:  -aim for 20 grams of protein at breakfast PROGRESSING  -increase soluble fiber in the diet PROGRESSING    She just increased dose of Fibercon to 2 caplets this week along with Mg Citrate 2x/day. Diet has stayed relatively the same, still working on accepting her weight and allowing herself to have some foods she enjoys. Allowing herself 4 pieces of Dove dark chocolate with lunch and one diet soda a week.   Notes she is recovering from  shoulder surgery well and is happy about that.   Still complains of dry mouth, eyes, etc feels dehydrated all the time.    GI Symptoms: has a normal sized bm every 5-6 days, has some little stool when urinating about 3x/day.     Diet Recall:  B: 1c  greek yogurt with 1c fruit   L: 7oz bag of antioxidant blend veg, 3-4 oz of cheese  Snack: egg white wrap, light sour cream   Beverages: 80oz of club soda mixed with 1/3 bottle kombucha, 3-4 Truly seltzers    INTERVENTION:  Nutrition Education and Counseling (30 minutes)  -GI symptom review   -Diet recall, how to add more fiber     Goal:  -add a fiber food to evening snack (wasa instead of egg wrap ot all a fruit or veg on side)   RD Goal: aiming for at least 15 grams of fiber daily     Patient verbalized understanding of recommendations discussed.      MONITORING & EVALUATION:  Monitor Food and Nutrient Intake, Medications, Physical Activity and Function, Anthropometrics, and Biochemical  Follow Up: 9/10 @ 11  _________________________________________________  FOLLOW UP ASSESSMENT (09/23/2022):  Mayren Reilley is  a 67 y.o. female referred with the following diagnosis (es): Celiac disease [K90.0]  Diarrhea, unspecified [R19.7]      Previous Goals:  -aim for 20 grams of protein at breakfast PROGRESSING  -increase soluble fiber in the diet PROGRESSING     Over the last ~2 months she has been working on increasing her protein at breakfast, aiming for 20-30 grams and adding more soluble fiber foods. Also trying to get in some greens.   She shares she's working on accepting that she may be at a healthful weight for her age.     Taking FiberCon once a day now, working back up to 2x/day since shoulder surgery, combination of meds caused bout of constipation.     Concern with quality of sleep & night terrors, states new PCP strongly encouraged getting rid of alcohol while taking Klonopin, afraid of being taken off the med. She is working on this change by sticking to 1 light beer a day.      Had EGD and colonoscopy on 3/14, no signs of lymphocytic colitis.   1 or 2 times a week has a large amount of stool. The rest of bowel movements occur almost every time she urinates, consisting of Bristol type 1, does not endorse pain.      Diet Recall:  B: Greek yogurt & fruit, or 2 eggs, low fat sour cream, parmesan cheese, on egg  life wrap   L: feta cheese on Wasa crisp, brussels sprouts, some chocolate after lunch  1 light beer   Beverages: 80oz of club soda mixed with 1/3 bottle kombucha, 1 light beer      -does have a few TB of honey throughout the day when she has a sweet craving. Only had chocolate this week because husband brought it in the house.      INTERVENTION:  Nutrition Education and Counseling (30 minutes)  -Discussed diet changes   -Reviewed [employer] Healthy Eating Plate as template for meal planning. Emphasized including mostly whole foods and how this mirrors the Mediterranean diet recommended by neurologist.   -No one food is inherently 'bad' and aiming for at least 85% consisting of whole foods.      Goals:  -aim for 20 grams  of protein at breakfast  -increase soluble fiber in the diet      Recommendations:  -Structured meal times  -3 meals daily  -space protein out throughout the day   -increase soluble fiber intake       Materials Provided and Discussed:   [employer] Healthy Eating Plate     Patient verbalized understanding of recommendations discussed.      MONITORING & EVALUATION:  Monitor Food and Nutrient Intake, Medications, Anthropometrics, and Biochemical  Follow Up: 6/4 @ 11    ___________________________________________________  INITIAL ASSESSMENT 08/05/2022   Kiane Wisener is a 67 y.o. female referred with the following diagnosis (es): Celiac disease [K90.0]  Diarrhea, unspecified [R19.7]   PMH includes IBS, chronic diarrhea, constipation, fecal incontinence, hx lymphocytic colitis   Labs: reviewed  Meds: linzess, FiberCon, Magnesium Citrate, miralax     Patient stated goal: get extra weight off (goal 105-108#).      Referral from GI per pt request. Colonoscopy scheduled for 3/28. She is most concerned with her weight gain, states normal weight is 105-108#. Notes lifelong history of body dysmorphia.   Brought list of foods given by neurologist to eat &not to eat. Feels she's very restricted (& frustrated) doesn't understand why she's gained weight.   She did bring 3 day food log.     Reports counting calories for 30+ years. Eats 2 meals per day, breafast and main meal around 2-3pm with a few alcoholic beverages every night. At this time not willing to give up alcohol intake.      Complains of constant dehydration regardless of water intake.   Notes 10-12 days of no bowel movements, then explosion of stool, Bristol type 3 this week, first 'normal' consistency stool in a long time.      Factors impacting food choices:   -Established food preferences/eating behaviors  -Confusion on nutrition advice  -Current nutrition attitudes/beliefs  -Altered GI structure/function  -Skips meals  -Negative relationship with food/food  fear  -Disordered Eating: skipping meals, preoccupation with food choices, guilt/shame with food choices, restrictive diet, avoiding food groups     Initial Diet Recall:   B: 2 slice wheat bread, 3 TB honey, 2 TB neufchtel cream cheese      L: Taco bell burrito supreme, large diet pepsi, 1 refill     Evening: 2 bluemoon light sky beers      Beverages: 80+ oz of club soda, 2 beers      Eating pattern appears excessive in refined carbohydrate, and inadequate in protein, fiber, fruit, and vegetables.  +Lifestyle/Family Influence/Support: lives with spouse      Movement includes not discussed.      Stage  of Change: Preparation.     Anthropometrics:   Height: 5\' 1"   Weight 119#   BMI: 22.48      Estimated Nutrition Needs:  MSJ x 1.2  (1,300kcal/d)      Protein: 60g/day (20%)      DIAGNOSIS:  Unbalanced diet related to nutrition attitudes/beliefs as evidenced by eating pattern as above, disordered eating behaviors, and desire for thinness.      INTERVENTION (60 minutes)  Discussed nutrition principles for weight management, including nutrient density of food choices and healthy food/lifestyle behaviors. Reviewed Reviewed Healthy Eating Plate as template for meal planning and portion guide, as well as basic meal pattern guidelines. Reviewed recall and provided recommendations for change. Answered questions. Addressed cognitive distortions regarding weight and nutrition.      Specific Topics Discussed:  - Daily targets for protein and fiber  - Strategies for increasing fiber at meals and snacks  -Viscous fiber can help provide consistency to loose stool   -Aim for at least 17g of fiber per day, slowly increase intake  -tracking what you eat is helpful to identify triggers      Utilized the following behavior change strategies: MI, reflection, goal setting              Materials Provided and Discussed:              -Fiber in Foods List  -20g Protein Handout     Verbalized understanding of recommendations discussed.      Goals:  -aim for 20 grams of protein at breakfast  -increase soluble fiber in the diet      Recommendations:  -Structured meal times  -3 meals daily  -space protein out throughout the day   -increase soluble fiber intake      MONITORING & EVALUATION:  Monitor Food and Nutrient Intake, Medications, Physical Activity and Function, Anthropometrics, and Biochemical  Follow Up: 4/2 @ 11

## 2023-03-03 ENCOUNTER — Inpatient Hospital Stay: Primary: Family Medicine

## 2023-05-12 ENCOUNTER — Encounter: Admit: 2023-05-12

## 2023-05-12 DIAGNOSIS — H532 Diplopia: Secondary | ICD-10-CM

## 2023-05-12 NOTE — Progress Notes (Unsigned)
CT BRAIN

## 2023-07-31 ENCOUNTER — Inpatient Hospital Stay
Admit: 2023-07-31 | Discharge: 2023-08-01 | Disposition: A | Discharge status: Home / Self Care | Payer: MEDICARE | Attending: Student in an Organized Health Care Education/Training Program

## 2023-07-31 ENCOUNTER — Emergency Department: Admit: 2023-08-01 | Payer: MEDICARE | Primary: Family Medicine

## 2023-07-31 DIAGNOSIS — S01511A Laceration without foreign body of lip, initial encounter: Secondary | ICD-10-CM

## 2023-07-31 DIAGNOSIS — S0083XA Contusion of other part of head, initial encounter: Secondary | ICD-10-CM

## 2023-07-31 NOTE — ED Notes (Signed)
Patient mobility status  with no difficulty.     I have reviewed discharge instructions with the patient.  The patient verbalized understanding.    Patient left ED via Discharge Method: ambulatory to Home with  spouse .    Opportunity for questions and clarification provided.     Patient given 1 scripts.

## 2023-07-31 NOTE — ED Triage Notes (Signed)
Pt came into ED s/p fall, has lip laceration to inside bottom lip

## 2023-07-31 NOTE — ED Provider Notes (Signed)
 Emergency Department Provider Note       PCP: Cassell Smiles, MD   Age: 68 y.o.   Sex: female     DISPOSITION Decision To Discharge 07/31/2023 08:58:34 PM    ICD-10-CM    1. Contusion of face, initial encounter  S00.83XA       2. Lip laceration, initial encounter  S01.511A           Medical Decision Making     68 year old female presenting to this department after trip and fall with subsequent facial contusion and laceration.  Obtain CT imaging of the area without evidence of acute fracture.  Lip laceration is superficial in nature, does not suggest a through and through injury and does not violate the vermilion border.  I do not think patient requires suture repair of this injury.  She was given a dose of Norco in this department feels better.  She is up-to-date on tetanus status, I think patient is stable for discharge home with outpatient follow-up     1 or more acute illnesses that pose a threat to life or bodily function.   Prescription drug management performed.  Shared medical decision making was utilized in creating the patients health plan today.  I independently ordered and reviewed each unique test.       The patients assessment required an independent historian: Significant other, Dr.'s care provider.  The reason they were needed is important historical information not provided by the patient.    I interpreted the CT Scan no ICH.              History     68 year old female patient presents to this department after initial evaluation at [facility] with reports of lip laceration, facial pain/swelling after a fall from standing height while walking her dog earlier today.  Patient states she was walking with her dog when the dog certainly turned to the side causing her to trip over the lesion animal itself.  She fell to the ground impacting her face against concrete surface.  She denies loss of consciousness, was able to stand there after.  She does report jaw and facial pain after the event.  She was  noted to have a laceration to the internal aspect of the lip, reports tetanus status is up-to-date.  She denies blood thinner therapy.  She was reporting significant pain over the lower jaw and lip at this time.  She has not had any medication for pain thus far    The history is provided by the patient and a significant other. No language interpreter was used.     Physical Exam     Vitals signs and nursing note reviewed:  Vitals:    07/31/23 1718 07/31/23 2109   BP: (!) 141/60    Pulse: 61 87   Resp: 18 18   Temp: 98.4 F (36.9 C)    TempSrc: Oral    SpO2: 100% 100%   Weight: 58.5 kg (129 lb)    Height: 1.549 m (5\' 1" )       Physical Exam  Vitals and nursing note reviewed.   Constitutional:       General: She is not in acute distress.     Appearance: Normal appearance. She is not ill-appearing or toxic-appearing.   HENT:      Head: Normocephalic.      Comments: Visible swelling with a superficial abrasion noted the lower lip.  This does not appear to be a through and through  laceration contiguous with the internal laceration.  Patient has a deeper, 1.5 cm laceration to the internal aspect of the lip, this does not involve the vermilion border.  There is generalized swelling to the lower lip as well    No hemotympanums, Battle sign or raccoon eyes.     Right Ear: External ear normal.      Left Ear: External ear normal.      Nose: Nose normal.      Mouth/Throat:      Mouth: Mucous membranes are moist.   Eyes:      Extraocular Movements: Extraocular movements intact.      Pupils: Pupils are equal, round, and reactive to light.   Pulmonary:      Effort: Pulmonary effort is normal. No respiratory distress.   Abdominal:      General: Abdomen is flat.   Musculoskeletal:         General: Normal range of motion.      Cervical back: Normal range of motion.      Comments: No reproducible tenderness with palpation of the cervical thoracic or lumbar spine, no palpated step-off or deformity noted.   Skin:     General: Skin is  warm.      Capillary Refill: Capillary refill takes less than 2 seconds.   Neurological:      Mental Status: She is alert.   Psychiatric:         Mood and Affect: Mood normal.          Procedures     Procedures    Orders placed during this emergency department visit:     Orders Placed This Encounter   Procedures    CT HEAD WO CONTRAST    CT MAXILLOFACIAL WO CONTRAST    Ice to affected area    Wound dressing        Medications given during this emergency department visit:     Medications   HYDROcodone-acetaminophen (NORCO) 7.5-325 MG per tablet 1 tablet (1 tablet Oral Given 07/31/23 1928)       New prescriptions:     Discharge Medication List as of 07/31/2023  9:02 PM        START taking these medications    Details   ketorolac (TORADOL) 10 MG tablet Take 1 tablet by mouth every 6 hours as needed for Pain, Disp-10 tablet, R-0Normal              Past History and Complexity:     No past medical history on file.     No past surgical history on file.     Social History     Socioeconomic History    Marital status: Married     Social Determinants of Health     Financial Resource Strain: Low Risk  (09/08/2022)    Received from [facility], [facility]    Financial Resource Strain     Difficulty Paying Living Expenses: Not hard at all     Difficulty Paying Medical Expenses: No   Food Insecurity: No Food Insecurity (09/08/2022)    Received from [facility], [facility]    Food Insecurity     Worried about Running Out of Food in the Last Year: Never true     Ran Out of Food in the Last Year: Never true   Transportation Needs: No Transportation Needs (09/08/2022)    Received from [facility], [facility]    Transportation Needs     Lack of Transportation: No  Physical Activity: Inactive (09/08/2022)    Received from [facility], [facility]    Physical Activity     Days of Exercise per Week: 0     Minutes of Exercise per Session: 0     Total Minutes of Exercise per Week: 0   Stress: Stress Concern Present (09/08/2022)     Received from [facility], [facility]    Stress     Feeling of Stress : Quite a bit   Social Connections: Moderately Isolated (09/08/2022)    Received from [facility], [facility]    Social Connections     Frequency of Communication with Friends and Family: Once a week     Frequency of Social Gatherings with Friends and Family: Never   Intimate Partner Violence: Not At Risk (09/08/2022)    Received from [facility], [facility]    Intimate Partner Violence     Fear of Current or Ex-Partner: No     Emotionally Abused: No     Physically Abused: No     Sexually Abused: No   Housing Stability: Not At Risk (09/08/2022)    Received from [facility], [facility]    Housing Stability     Was there a time when you did not have a steady place to sleep: No     Worried that the place you are staying is making you sick: No        Discharge Medication List as of 07/31/2023  9:02 PM           Results from this emergency department visit:      Results for orders placed or performed during the hospital encounter of 07/31/23   CT HEAD WO CONTRAST    Narrative    Head CT, CT face    INDICATION: Headache    TECHNIQUE: Multiple 2D axial images obtained through the brain without  intravenous contrast.  Radiation dose reduction techniques were used for this  study:  All CT scans performed at this facility use one or all of the following:  Automated exposure control, adjustment of the mA and/or kVp according to  patient's size, iterative reconstruction.    COMPARISON: Reviewed    FINDINGS: No areas of abnormal attenuation are seen in the brain. There is no CT  evidence of acute hemorrhage or infarction. The ventricles are normal in size.  There are no extra-axial fluid collections. No masses are seen. The sinuses are  clear. There are no bony lesions. Facial bones are intact. Pterygoid plates are  preserved. Zygomatic arches are normal.      Impression    No acute CT process in the brain or face.    Electronically signed by  Lala Lund   CT MAXILLOFACIAL WO CONTRAST    Narrative    Head CT, CT face    INDICATION: Headache    TECHNIQUE: Multiple 2D axial images obtained through the brain without  intravenous contrast.  Radiation dose reduction techniques were used for this  study:  All CT scans performed at this facility use one or all of the following:  Automated exposure control, adjustment of the mA and/or kVp according to  patient's size, iterative reconstruction.    COMPARISON: Reviewed    FINDINGS: No areas of abnormal attenuation are seen in the brain. There is no CT  evidence of acute hemorrhage or infarction. The ventricles are normal in size.  There are no extra-axial fluid collections. No masses are seen. The sinuses are  clear. There are no bony lesions. Facial bones are intact. Pterygoid plates are  preserved. Zygomatic arches are normal.      Impression    No acute CT process in the brain or face.    Electronically signed by Lala Lund         CT HEAD WO CONTRAST   Final Result   No acute CT process in the brain or face.      Electronically signed by Lala Lund      CT MAXILLOFACIAL WO CONTRAST   Final Result   No acute CT process in the brain or face.      Electronically signed by Lala Lund                   No results for input(s): "COVID19" in the last 72 hours.     Voice dictation software was used during the making of this note.  This software is not perfect and grammatical and other typographical errors may be present.  This note has not been completely proofread for errors.      Lacie Draft, [place]  08/01/23 2329

## 2023-07-31 NOTE — Discharge Instructions (Signed)
CT imaging tonight shows no evidence of acute fracture or other intracranial abnormality. apply ice to tender, swollen areas to help with swelling and inflammation.  Take the medication prescribed or Tylenol for control of your discomfort.  Arrange follow-up with your primary care provider as needed.  Return for worsened symptoms, concerns or questions

## 2023-08-01 MED ORDER — KETOROLAC TROMETHAMINE 10 MG PO TABS
10 | ORAL_TABLET | Freq: Four times a day (QID) | ORAL | 0 refills | Status: AC | PRN
Start: 2023-08-01 — End: ?

## 2023-08-01 MED ORDER — HYDROCODONE-ACETAMINOPHEN 7.5-325 MG PO TABS
7.5-325 | ORAL | Status: AC
Start: 2023-08-01 — End: 2023-07-31
  Administered 2023-08-01: 1 via ORAL

## 2023-08-01 MED FILL — HYDROCODONE-ACETAMINOPHEN 7.5-325 MG PO TABS: 7.5-325 MG | ORAL | Qty: 1
